# Patient Record
Sex: Female | Born: 1950 | Race: White | Hispanic: Refuse to answer | Marital: Single | Smoking: Former smoker
Health system: Southern US, Community
[De-identification: ages and names within clinical notes are randomized; demographics above are authoritative.]

## PROBLEM LIST (undated history)

## (undated) DIAGNOSIS — T7840XA Allergy, unspecified, initial encounter: Secondary | ICD-10-CM

## (undated) DIAGNOSIS — F419 Anxiety disorder, unspecified: Secondary | ICD-10-CM

## (undated) DIAGNOSIS — D649 Anemia, unspecified: Secondary | ICD-10-CM

## (undated) DIAGNOSIS — G8929 Other chronic pain: Secondary | ICD-10-CM

## (undated) DIAGNOSIS — R519 Headache, unspecified: Secondary | ICD-10-CM

## (undated) DIAGNOSIS — R51 Headache: Secondary | ICD-10-CM

## (undated) DIAGNOSIS — F32A Depression, unspecified: Secondary | ICD-10-CM

## (undated) DIAGNOSIS — J189 Pneumonia, unspecified organism: Secondary | ICD-10-CM

## (undated) HISTORY — DX: Anemia, unspecified: D64.9

## (undated) HISTORY — DX: Pneumonia, unspecified organism: J18.9

## (undated) HISTORY — PX: TUBAL LIGATION: SHX77

## (undated) HISTORY — DX: Depression, unspecified: F32.A

## (undated) HISTORY — DX: Allergy, unspecified, initial encounter: T78.40XA

## (undated) HISTORY — PX: BREAST LUMPECTOMY: SHX2

## (undated) HISTORY — DX: Anxiety disorder, unspecified: F41.9

## (undated) HISTORY — DX: Other chronic pain: G89.29

## (undated) HISTORY — DX: Headache, unspecified: R51.9

## (undated) HISTORY — DX: Headache: R51

---

## 2011-12-04 ENCOUNTER — Inpatient Hospital Stay: Payer: Self-pay | Admitting: Internal Medicine

## 2011-12-04 LAB — CBC
HCT: 29.8 % — ABNORMAL LOW (ref 35.0–47.0)
HGB: 10.6 g/dL — ABNORMAL LOW (ref 12.0–16.0)
MCH: 34.5 pg — ABNORMAL HIGH (ref 26.0–34.0)
MCHC: 35.5 g/dL (ref 32.0–36.0)
MCV: 97 fL (ref 80–100)
RBC: 3.06 10*6/uL — ABNORMAL LOW (ref 3.80–5.20)

## 2011-12-04 LAB — COMPREHENSIVE METABOLIC PANEL
Albumin: 2.9 g/dL — ABNORMAL LOW (ref 3.4–5.0)
Anion Gap: 11 (ref 7–16)
Calcium, Total: 9.3 mg/dL (ref 8.5–10.1)
Glucose: 107 mg/dL — ABNORMAL HIGH (ref 65–99)
Osmolality: 277 (ref 275–301)
Potassium: 3.5 mmol/L (ref 3.5–5.1)
SGOT(AST): 61 U/L — ABNORMAL HIGH (ref 15–37)
Total Protein: 8 g/dL (ref 6.4–8.2)

## 2011-12-04 LAB — CK TOTAL AND CKMB (NOT AT ARMC): CK, Total: 150 U/L (ref 21–215)

## 2011-12-04 LAB — VALPROIC ACID LEVEL: Valproic Acid: <3 ug/mL — ABNORMAL LOW

## 2011-12-04 LAB — PRO B NATRIURETIC PEPTIDE: B-Type Natriuretic Peptide: 3350 pg/mL — ABNORMAL HIGH (ref 0–125)

## 2011-12-05 LAB — IRON AND TIBC
Iron Bind.Cap.(Total): 197 ug/dL — ABNORMAL LOW (ref 250–450)
Iron Saturation: 14 %
Iron: 27 ug/dL — ABNORMAL LOW (ref 50–170)
Unbound Iron-Bind.Cap.: 170 ug/dL

## 2011-12-05 LAB — CBC WITH DIFFERENTIAL/PLATELET
Basophil #: 0 10*3/uL (ref 0.0–0.1)
Eosinophil #: 0.1 10*3/uL (ref 0.0–0.7)
HCT: 26.1 % — ABNORMAL LOW (ref 35.0–47.0)
Lymphocyte #: 0.7 10*3/uL — ABNORMAL LOW (ref 1.0–3.6)
Lymphocyte %: 5.8 %
MCH: 32 pg (ref 26.0–34.0)
MCHC: 33.8 g/dL (ref 32.0–36.0)
MCV: 95 fL (ref 80–100)
Monocyte #: 1.1 10*3/uL — ABNORMAL HIGH (ref 0.0–0.7)
Monocyte %: 9.2 %
Neutrophil #: 10.2 10*3/uL — ABNORMAL HIGH (ref 1.4–6.5)
Neutrophil %: 84.4 %
Platelet: 294 10*3/uL (ref 150–440)
RDW: 12.7 % (ref 11.5–14.5)
WBC: 12 10*3/uL — ABNORMAL HIGH (ref 3.6–11.0)

## 2011-12-05 LAB — BASIC METABOLIC PANEL
BUN: 21 mg/dL — ABNORMAL HIGH (ref 7–18)
Calcium, Total: 8.4 mg/dL — ABNORMAL LOW (ref 8.5–10.1)
Creatinine: 0.59 mg/dL — ABNORMAL LOW (ref 0.60–1.30)
EGFR (Non-African Amer.): >60
Glucose: 90 mg/dL (ref 65–99)
Sodium: 140 mmol/L (ref 136–145)

## 2011-12-05 LAB — HEMOGLOBIN A1C: Hemoglobin A1C: 5.7 % (ref 4.2–6.3)

## 2011-12-07 LAB — CBC WITH DIFFERENTIAL/PLATELET
Basophil #: 0 10*3/uL (ref 0.0–0.1)
Eosinophil %: 0 %
Lymphocyte #: 0.5 10*3/uL — ABNORMAL LOW (ref 1.0–3.6)
Lymphocyte %: 4.2 %
MCH: 32.1 pg (ref 26.0–34.0)
MCHC: 34.2 g/dL (ref 32.0–36.0)
MCV: 94 fL (ref 80–100)
Monocyte #: 0.5 10*3/uL (ref 0.0–0.7)
Monocyte %: 4.3 %
Neutrophil #: 10.5 10*3/uL — ABNORMAL HIGH (ref 1.4–6.5)
Neutrophil %: 91.4 %
Platelet: 424 10*3/uL (ref 150–440)
RBC: 2.68 10*6/uL — ABNORMAL LOW (ref 3.80–5.20)
RDW: 13.4 % (ref 11.5–14.5)

## 2011-12-07 LAB — BASIC METABOLIC PANEL
Anion Gap: 11 (ref 7–16)
BUN: 11 mg/dL (ref 7–18)
Calcium, Total: 8.5 mg/dL (ref 8.5–10.1)
Chloride: 101 mmol/L (ref 98–107)
EGFR (Non-African Amer.): >60
Glucose: 154 mg/dL — ABNORMAL HIGH (ref 65–99)
Osmolality: 291 (ref 275–301)
Potassium: 3 mmol/L — ABNORMAL LOW (ref 3.5–5.1)

## 2011-12-08 LAB — EXPECTORATED SPUTUM ASSESSMENT W GRAM STAIN, RFLX TO RESP C

## 2011-12-09 LAB — HEMOGLOBIN: HGB: 10.4 g/dL — ABNORMAL LOW (ref 12.0–16.0)

## 2011-12-10 LAB — CULTURE, BLOOD (SINGLE)

## 2011-12-26 LAB — EXPECTORATED SPUTUM ASSESSMENT W GRAM STAIN, RFLX TO RESP C

## 2011-12-30 ENCOUNTER — Encounter: Payer: Self-pay | Admitting: Pulmonary Disease

## 2011-12-30 ENCOUNTER — Ambulatory Visit (INDEPENDENT_AMBULATORY_CARE_PROVIDER_SITE_OTHER): Payer: Self-pay | Admitting: Pulmonary Disease

## 2011-12-30 DIAGNOSIS — H6690 Otitis media, unspecified, unspecified ear: Secondary | ICD-10-CM | POA: Insufficient documentation

## 2011-12-30 DIAGNOSIS — J441 Chronic obstructive pulmonary disease with (acute) exacerbation: Secondary | ICD-10-CM | POA: Insufficient documentation

## 2011-12-30 DIAGNOSIS — J449 Chronic obstructive pulmonary disease, unspecified: Secondary | ICD-10-CM | POA: Insufficient documentation

## 2011-12-30 DIAGNOSIS — J159 Unspecified bacterial pneumonia: Secondary | ICD-10-CM | POA: Insufficient documentation

## 2011-12-30 DIAGNOSIS — J189 Pneumonia, unspecified organism: Secondary | ICD-10-CM

## 2011-12-30 MED ORDER — BUDESONIDE-FORMOTEROL FUMARATE 160-4.5 MCG/ACT IN AERO: 2.0000 | INHALATION_SPRAY | Freq: Two times a day (BID) | RESPIRATORY_TRACT | Status: DC

## 2011-12-30 MED ORDER — CEFDINIR 300 MG PO CAPS: 600.0000 mg | ORAL_CAPSULE | Freq: Every day | ORAL | Status: AC

## 2011-12-30 NOTE — Assessment & Plan Note (Signed)
COPD is possible given her recent symptoms of shortness of breath and wheezing, but she has not had spirometry.  She is still not feeling well today due to the otitis media, so we will treat this prior to performing spirometry.  In the meantime, I have asked her to continue taking the symbicort and prn albuterol

## 2011-12-30 NOTE — Patient Instructions (Addendum)
You have ear infections in both ears. We will call in a prescription for Cefdinir 600mg  by mouth daily for one week. Use Neil Med rinses with distilled water at least twice per day using the instructions on the package. Use chlortrimeton and an over the counter decongestant (ask the pharmacist for a recommendation) as needed for congestion.  Continue using the inhalers as written.  We will see you back in one month for simple spirometry and a repeat Chest X-ray.

## 2011-12-30 NOTE — Assessment & Plan Note (Signed)
Bilateral otitis media noted on exam today.  Plan: -Omnicef for 7 days -Saline rinses, decongestants -consider CT sinuses or ENT referral if no improvement

## 2011-12-30 NOTE — Progress Notes (Signed)
Subjective:    Patient ID: Monique [name], female    DOB: [birth year], 61 y.o.   MRN: [id number]  HPI 61 y/o female presents to our clinic for evaluation after a recent hospitalization for multi-lobar pneumonia.  She states that in December 2012 she developed the progressive onset of shortness of breath, chest congestion, cough and sputum production.  She also noted sinus congestion and fullness and decreased hearing acuity.   She was admitted for what sounds like 7-10 days (I do not have the d/c summary and she can't remember the details).  She was discharged on levaquin and prednisone and now states that she is doing much better.  She was discharged on oxygen at 3 L per minute.  She states that she is now back to walking her dog around the house and her cough has nearly resolved.  However, she still has decreased hearing acuity in both ears and fullness in both ears.   She quit smoking just prior to the hospitalization.  Past Medical History  Diagnosis Date  . Chronic headaches   . Bilateral pneumonia      Family History  Problem Relation Age of Onset  . Emphysema Maternal Grandfather     smoked a pipe  . Stomach cancer Maternal Aunt   . Lung cancer Maternal Uncle     was a smoker     History   Social History  . Marital Status: Divorced    Spouse Name: N/A    Number of Children: 1  . Years of Education: N/A   Occupational History  . Unemployed    Social History Main Topics  . Smoking status: Former Smoker -- 0.8 packs/day for 42 years    Types: Cigarettes    Quit date: 12/02/2011  . Smokeless tobacco: Never Used  . Alcohol Use: No  . Drug Use: No  . Sexually Active: Not on file   Other Topics Concern  . Not on file   Social History Narrative  . No narrative on file     Allergies  Allergen Reactions  . Penicillins     "bleeding"     No outpatient prescriptions prior to visit.    Review of Systems  Constitutional: Negative for fever, chills and unexpected  weight change.  HENT: Positive for ear pain, nosebleeds, congestion and rhinorrhea. Negative for sore throat, sneezing, trouble swallowing, dental problem, voice change, postnasal drip and sinus pressure.   Eyes: Negative for visual disturbance.  Respiratory: Negative for cough, choking and shortness of breath.   Cardiovascular: Negative for chest pain and leg swelling.  Gastrointestinal: Negative for vomiting, abdominal pain and diarrhea.  Genitourinary: Negative for difficulty urinating.  Musculoskeletal: Negative for arthralgias.  Skin: Negative for rash.  Neurological: Negative for tremors, syncope and headaches.  Hematological: Bruises/bleeds easily.       Objective:   Physical Exam Filed Vitals:   12/30/11 1424  BP: 102/60  Pulse: 79  Temp: 97.9 F (36.6 C)  TempSrc: Oral  Height: 5\' 7"  (1.702 m)  Weight: 126 lb 1.9 oz (57.208 kg)  SpO2: 100%  O2: 3L/min [place]  Gen: well appearing, no acute distress HEENT: NCAT, PERRL, EOMi, OP clear, TM's erythematous, buldging Neck: supple without masses PULM: Few insp crackles RUL, otherwise clear CV: RRR, no mgr, no JVD AB: BS+, soft, nontender, no hsm Ext: warm, no edema, no clubbing, no cyanosis Derm: no rash or skin breakdown Neuro: A&Ox4, CN II-XII intact, strength 5/5 in all 4 extremities  Review of CXR  from 11/2011 [facility]: multi-lobar pneumonia  Review of CT chest 11/2011 [facility]: multi-lobar pneumonia, no mass     Assessment & Plan:   Otitis media Bilateral otitis media noted on exam today.  Plan: -Omnicef for 7 days -Saline rinses, decongestants -consider CT sinuses or ENT referral if no improvement  COPD (chronic obstructive pulmonary disease) COPD is possible given her recent symptoms of shortness of breath and wheezing, but she has not had spirometry.  She is still not feeling well today due to the otitis media, so we will treat this prior to performing spirometry.  In the meantime, I have asked her to continue  taking the symbicort and prn albuterol  Community acquired bacterial pneumonia She states that she feels much better and this appears to be resolving.  On exam today she had some residual crackles, but her improved exercise tolerance and lack of cough suggest this is improving well.  I will order a repeat CXR for her follow up visit in 4 weeks to ensure radiographic resolution.    Updated Medication List Outpatient Encounter Prescriptions as of 12/30/2011  Medication Sig Dispense Refill  . budesonide-formoterol (SYMBICORT) 160-4.5 MCG/ACT inhaler Inhale 2 puffs into the lungs 2 (two) times daily.  1 Inhaler  2  . divalproex (DEPAKOTE) 500 MG DR tablet Take 500 mg by mouth 2 (two) times daily.      . ferrous sulfate 325 (65 FE) MG tablet Take 325 mg by mouth daily.      . potassium chloride SA (K-DUR,KLOR-CON) 20 MEQ tablet Take 20 mEq by mouth daily.      . sertraline (ZOLOFT) 100 MG tablet Take 100 mg by mouth daily.      Marland Kitchen DISCONTD: budesonide-formoterol (SYMBICORT) 160-4.5 MCG/ACT inhaler Inhale 2 puffs into the lungs 2 (two) times daily.      . cefdinir (OMNICEF) 300 MG capsule Take 2 capsules (600 mg total) by mouth daily.  14 capsule  0

## 2011-12-30 NOTE — Assessment & Plan Note (Signed)
She states that she feels much better and this appears to be resolving.  On exam today she had some residual crackles, but her improved exercise tolerance and lack of cough suggest this is improving well.  I will order a repeat CXR for her follow up visit in 4 weeks to ensure radiographic resolution.

## 2012-01-01 ENCOUNTER — Telehealth: Payer: Self-pay | Admitting: Pulmonary Disease

## 2012-01-01 NOTE — Telephone Encounter (Signed)
I called her and addressed the issues.  Will leave Lloyd Huger Med gel spray at the desk here for her to pick up.  Have asked that she use the symbicort once per day until we see her next.

## 2012-01-01 NOTE — Telephone Encounter (Signed)
Received the following e-mail from this pt  Hello Monique [name]. I hope you're having a good day!  Having read the instructions on the parting paperwork I was giving at the end of my  appointment I went to [facility] and picked up the [place] Med rinse and the Chlortrimeton.  When I got home I read the back of the box on the [place] Med rinse and it clearly states  not to use the product if you have an ear infection. Hmmmm - since I have an ear infection in both  ears why did he recommend this product?  My symbicort (prescribed by the hospital for my double pnuemonia) is almost empty and they  didn't give any refills so he gave me another presciption for it which I had filled @ $257.88.  Having read the accompanying paperwork it says if you have a current infection it can make it worse.  Also he doubled the hopital's recommended dosage from one puff twice a day twice a day to two puffs  twice a day.  Until I hear back from you I won't use the Lloyd Huger Med product nor will I use the new prescription  for symbicort (I'm still using the one old one at the hosptial dosage).    Thank you,  Monique [name] and spoke with pt. Advised that I received her e-mail, but in the future the best way to reach [place] is by calling and leaving a telephone msg. Pt verbalized understanding of this.  Pt states that she is wondering about when to start using sinus rinses because package insert reads that pt's with active ear infections should not use this. She also states that she has no insurance and symbicort will cost over over 250 $.  She states that she also ready where symbicort can make infections worsen. She states does not mind paying for med if it will help her, but wants recs from Dr. Kendrick Fries. I advised that he is currently seeing pt's at this time, will forward him the msg and call her later with response. Please advise, thanks!

## 2012-01-06 ENCOUNTER — Telehealth: Payer: Self-pay | Admitting: Pulmonary Disease

## 2012-01-06 DIAGNOSIS — R42 Dizziness and giddiness: Secondary | ICD-10-CM

## 2012-01-06 DIAGNOSIS — H9190 Unspecified hearing loss, unspecified ear: Secondary | ICD-10-CM

## 2012-01-06 DIAGNOSIS — H6690 Otitis media, unspecified, unspecified ear: Secondary | ICD-10-CM

## 2012-01-06 DIAGNOSIS — J449 Chronic obstructive pulmonary disease, unspecified: Secondary | ICD-10-CM

## 2012-01-06 NOTE — Telephone Encounter (Signed)
Called and spoke with pt.  States she is still having difficulty with her ears.  States her R ear is better but still having difficulty hearing out of L ear.  Also c/o dizziness.  Denies any ear pain.    Also, pt states she is on o2 24/7 and would like an order sent to her DME company, [facility], for a smaller portable o2 tank for when she is out getting groceries, etc.  Pt also requests an order for a humidifier for her o2.    Dr. Kendrick Fries, please advise. Thank you!

## 2012-01-07 NOTE — Telephone Encounter (Signed)
Pt calling again in reference to previous message can be reached at [phone].Monique [name]

## 2012-01-07 NOTE — Telephone Encounter (Signed)
If she needs an Rx for this it is OK by, just let me know how to do it.  I asked her to call the company for this when she was in the office.  As for the ears, I told her she should see ENT if no improvement... So she needs a referral to ENT (apparently this has been going on for a month).  I am on nights right now and likely won't be available to answer questions again until tonight.  If you need me to to something just let me know.  Thanks, [employer]

## 2012-01-07 NOTE — Telephone Encounter (Signed)
If needed, I have written an Rx for humidity with O2 and a portable tank and left it at the front desk of the [place] office.  See my previous note about ENT referral.

## 2012-01-07 NOTE — Telephone Encounter (Signed)
Called and spoke with pt.  Pt aware of Dr. Ulyses Jarred recs.  I have placed an order for DME referral to [facility] to get pt portable o2 and humidifer and also referral for ENT.

## 2012-01-29 ENCOUNTER — Ambulatory Visit: Payer: Self-pay | Admitting: Pulmonary Disease

## 2012-07-31 IMAGING — CT CT CHEST W/O CM
1 of 2 series · 16 of 28 positions shown, 20 images · non-contrast
Comparison: none

REASON FOR EXAM: hypoxia, eval RLL
COMMENTS:

[Series 2: soft tissue · axial · 0.58mm/px · z∈[-862,-596]mm · 16 of 59 slices shown, 20 images]
[im 3/59  mediastinal]
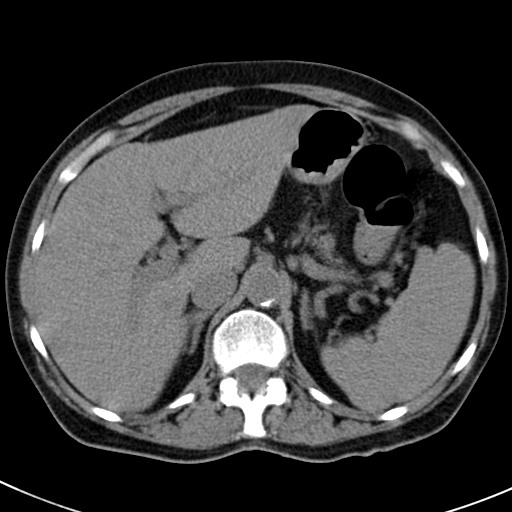
[im 3/59  lung]
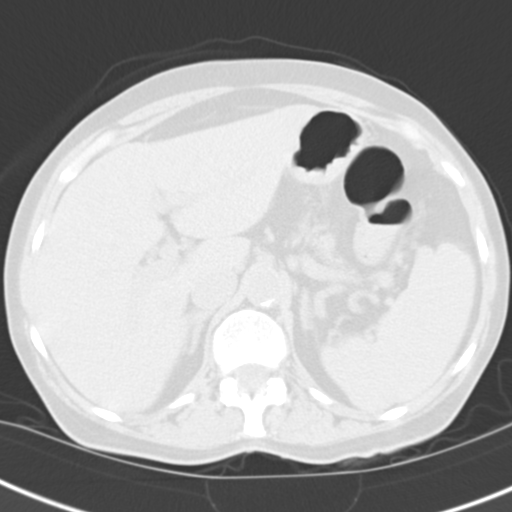
[im 8/59  lung]
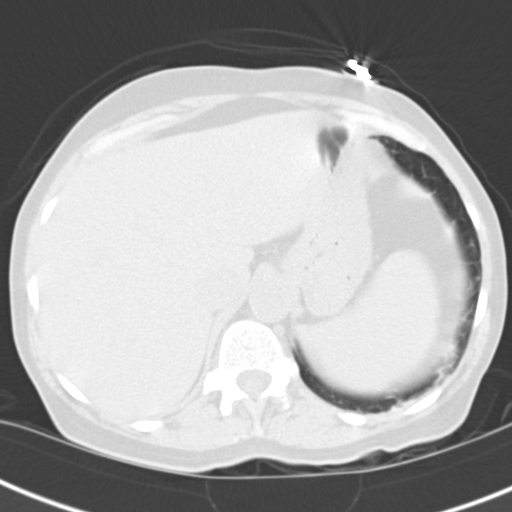
[im 10/59  lung]
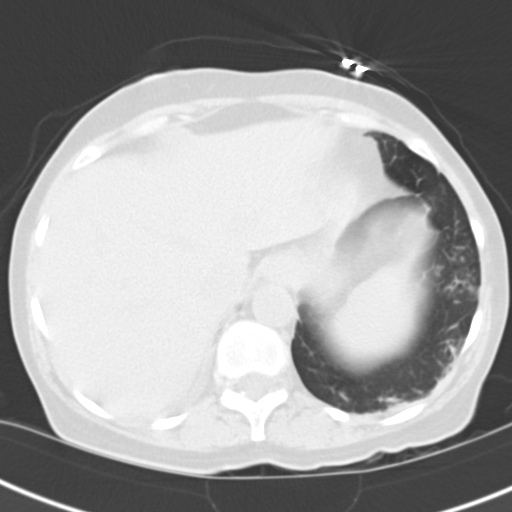
[im 15/59  lung]
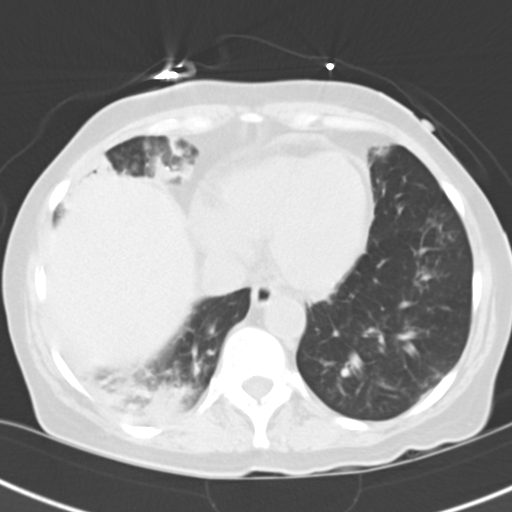
[im 17/59  mediastinal]
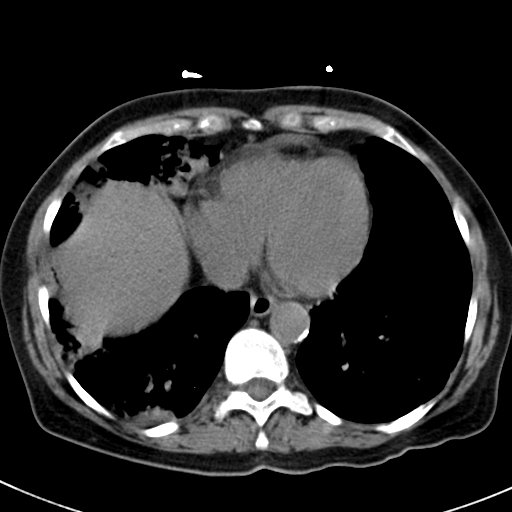
[im 17/59  lung]
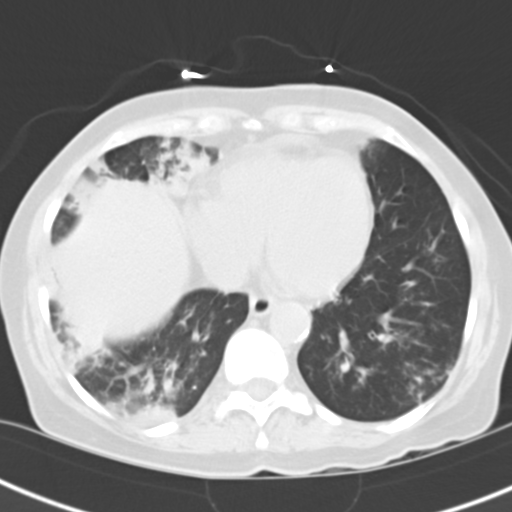
[im 20/59  lung]
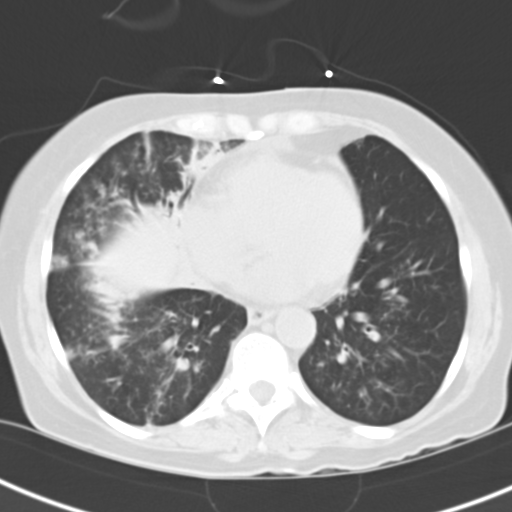
[im 25/59  lung]
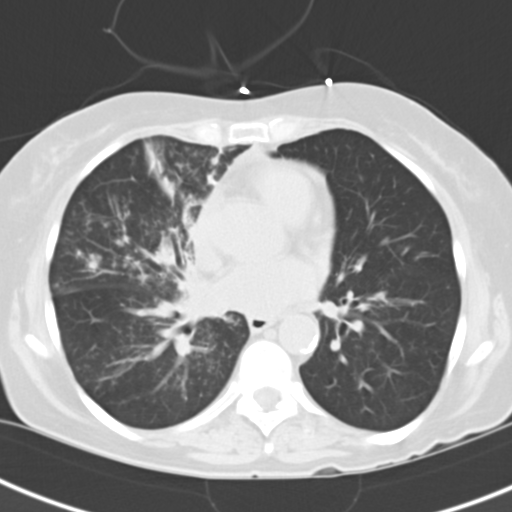
[im 27/59  lung]
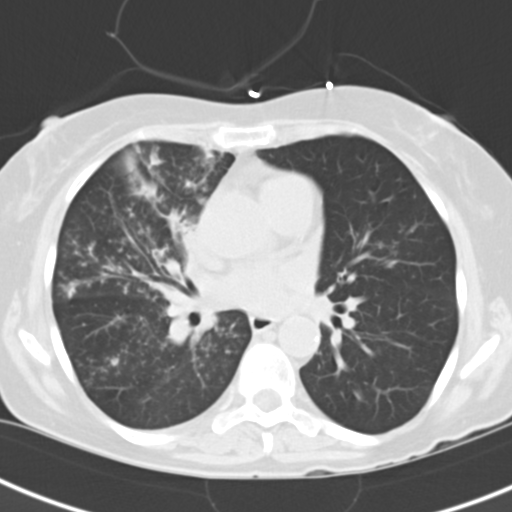
[im 32/59  mediastinal]
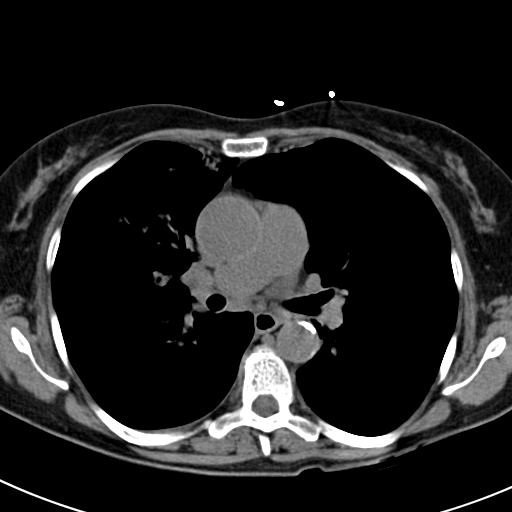
[im 32/59  lung]
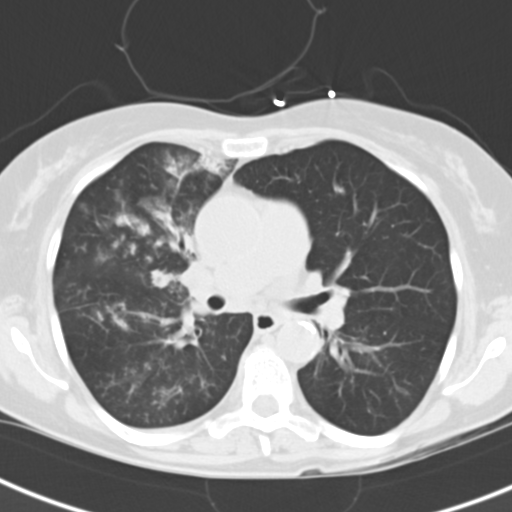
[im 34/59  lung]
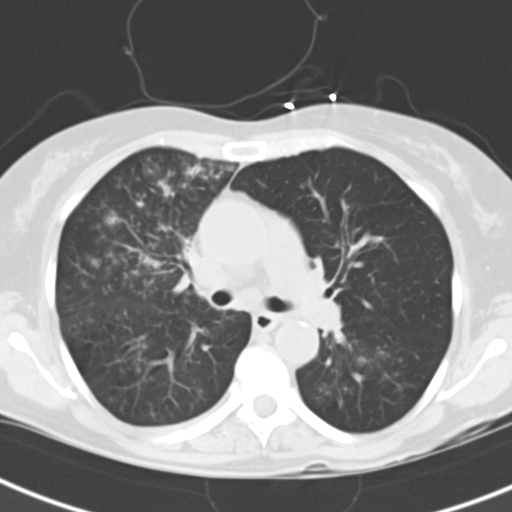
[im 39/59  lung]
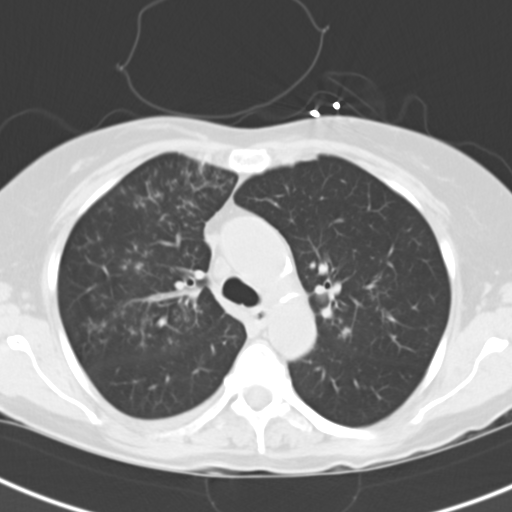
[im 42/59  lung]
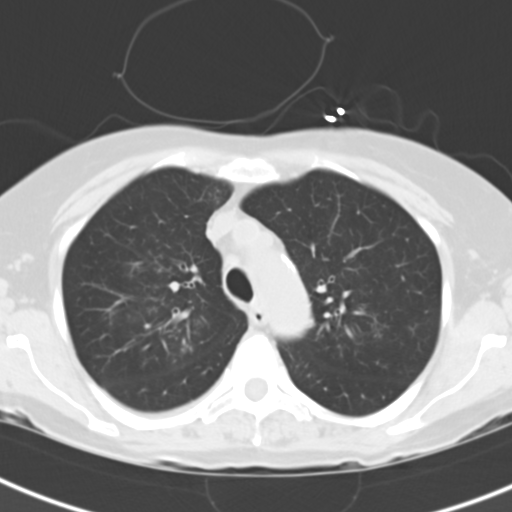
[im 44/59  mediastinal]
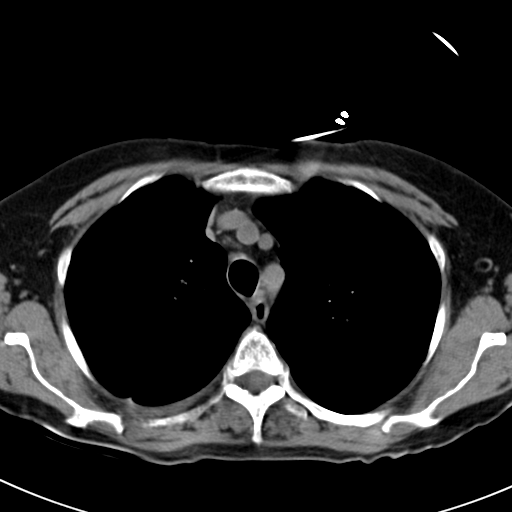
[im 44/59  lung]
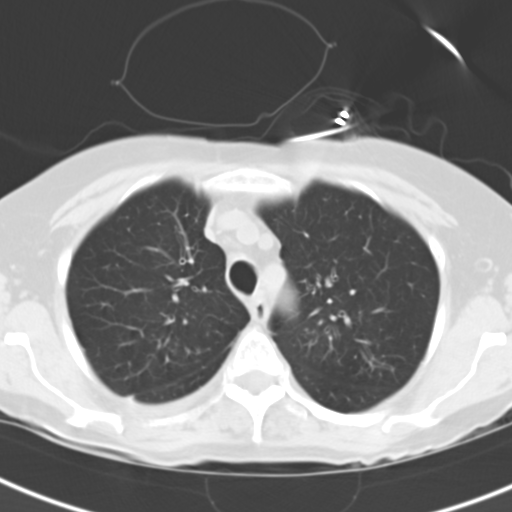
[im 49/59  lung]
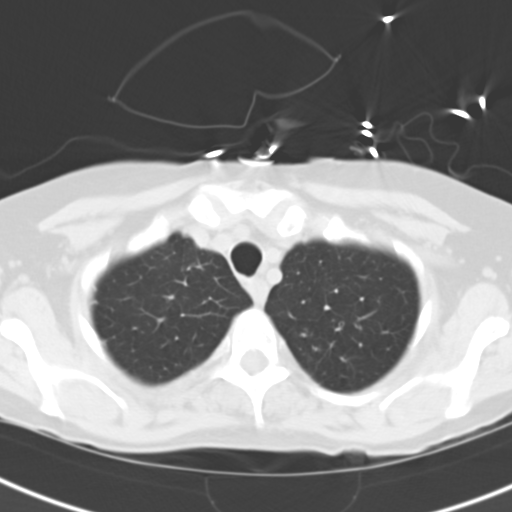
[im 51/59  lung]
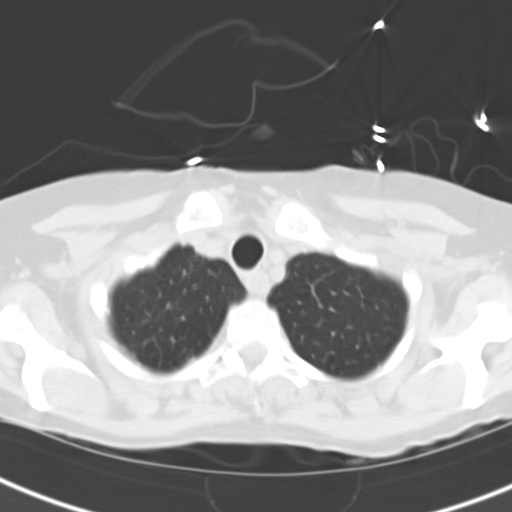
[im 56/59  lung]
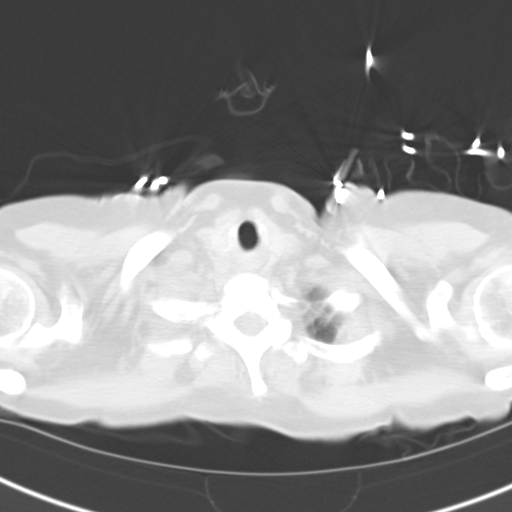

[16 of 28 positions shown; findings below may reference images not displayed]

PROCEDURE:     CT  - CT CHEST WITHOUT CONTRAST  - December 04, 2011  [DATE]

RESULT:     CT of the chest without contrast demonstrates right middle lobe,
right upper lobe and minimal right and left lower lobe pneumonia. This is
superimposed on diffuse emphysematous lung disease. No adenopathy is
evident. No significant effusion is present. There may be a trace pleural
effusion on the right. No pericardial effusion is demonstrated. The included
upper abdominal structures appear grossly normal area
IMPRESSION: 1. Patchy interstitial and alveolar infiltrates predominately on the right
and greatest in the right middle lobe and right lower lobe with some left
lower lobe and right upper lobe involvement. These changes are superimposed
on COPD. Trace right pleural effusion. Continued followup is recommended
with chest x-rays.

## 2014-08-04 ENCOUNTER — Ambulatory Visit: Payer: Self-pay

## 2015-03-19 NOTE — Discharge Summary (Signed)
PATIENT NAME:  Monique [name], [place] MR#:  [id number] DATE OF BIRTH:  Apr 02, 1951  DATE OF ADMISSION:  12/04/2011 DATE OF DISCHARGE:  12/09/2011  DISCHARGE DIAGNOSES:  1. Acute hypoxic respiratory failure likely due to bilateral pneumonia, improving, but will need home oxygen for now and maybe long-term.  2. Chronic obstructive pulmonary disease exacerbation with ongoing smoking, now improving.  3. Anemia of chronic disease, unsure exact etiology, possible iron deficiency, on iron replacement, stable hemoglobin and hematocrit.  4. Atrial fibrillation, transient and back in normal sinus rhythm.  5. Hypokalemia, repleted, and will require ongoing replacement. Could be due to poor oral intake.   SECONDARY DIAGNOSIS: Hyperacusis.   CONSULTATIONS:  1. Cardiology, Dr. Gwen PoundsKowalski.  2. Physical Therapy.   LABORATORY, DIAGNOSTIC AND RADIOLOGICAL DATA:  Chest x-ray on January 9th showed diffuse infiltrate in the right lower lung field compatible with pneumonia and atelectasis. Minimal infiltrate at the left base.  CT scan of the chest without contrast on January 9th showed patchy interstitial and alveolar infiltrate predominantly on the right and greater in the right middle and lower lobe with some left lower lobe and right upper lobe involvement. Trace right pleural effusion. Underlying chronic obstructive pulmonary disease.  Chest x-ray on January 11th showed persistent right lower lobe infiltrate and atelectasis.  A 2-D echocardiogram on January 9th showed normal LV size. No thrombus. Normal LV systolic function. Ejection fraction more than 55%.  Sputum culture grew moderate WBCs on January 10th. ____________________________ Ellamae SiaVipul S. Sherryll BurgerShah, MD vss:cbb D: 12/10/2011 16:01:20 ET T: 12/11/2011 10:03:36 ETJOB#: [id number]  Ellamae SiaVIPUL S [facility] MD ELECTRONICALLY SIGNED 12/12/2011 10:45

## 2015-03-19 NOTE — H&P (Signed)
PATIENT NAME:  Monique [name], Monique [name] MR#:  [id number] DATE OF BIRTH:  [birth year]  DATE OF ADMISSION:  12/04/2011  REFERRING PHYSICIAN: Daryel November, MD    CHIEF COMPLAINT: Shortness of breath, cough.   HISTORY OF PRESENT ILLNESS: The patient is a 64 year old Caucasian female with a history of hyperacusis who has been having shortness of breath, fevers, chills, and respiratory issues for three or more weeks. The patient has had a productive cough which is yellowish and at times blood-tinged. She has had fevers and chills off and on, and decreased p.o. intake, and 8-pound weight loss. The patient has been started on cefdinir on the 2nd and has not improved. The patient appears lethargic and on arrival here was hypoxic as well. The patient currently is on nonrebreather, and without oxygen  the saturations do drop to 80%. The patient has leukocytosis. She denies having any chest pains. She was given Levaquin, and the Hospitalist Service was  contacted for further evaluation and management.   PAST MEDICAL HISTORY: Hyperacusis.   MEDICATIONS:  1. Depakote 500 mg daily.  2. Zoloft 50 mg daily.   PAST SURGICAL HISTORY: None.  FAMILY HISTORY: Stomach and lung cancer.   SOCIAL HISTORY: The patient is a smoker, however, has not had tobacco in the past three weeks. No drugs. Occasional alcohol. She lives with her mom.    REVIEW OF SYSTEMS: CONSTITUTIONAL: The patient has fever, fatigue, weakness, and weight loss since the past 3+ weeks. EYES: No blurry vision or double vision. No redness. ENT: No tinnitus or ear pain. She has hyperacusis.  RESPIRATORY: The patient has a cough. No asthma or chronic obstructive pulmonary disease history. No painful respiration. CARDIOVASCULAR: No chest pain or orthopnea. No edema. No history of arrhythmia. The patient has some dyspnea on exertion. No syncope or palpitations. GI: No nausea, vomiting, diarrhea, abdominal pain. No melena or rectal bleeding. GENITOURINARY: No  dysuria or hematuria. ENDOCRINE: No polyuria or nocturia. HEME/LYMPH: No anemia or easy bruising. SKIN: No rashes. MUSCULOSKELETAL: No muscle or joint pains. NEUROLOGIC: General weakness. No focal weakness or dementia or ataxia. PSYCHIATRIC: No anxiety. She has depression.   PHYSICAL EXAMINATION:  VITAL SIGNS: Temperature on arrival 97.1, heart rate 120, respiratory rate 22, blood pressure 80/53, current blood pressure 100/57, oxygen saturation 99% on nonrebreather, dropped to 80% on room air.   GENERAL: Caucasian female in mild respiratory distress, lethargic-appearing. Lying in bed.   HEENT: Normocephalic, atraumatic. Pupils are equal and reactive. Dry mucous membranes. Anicteric sclerae.  NECK: Supple. No thyroid tenderness or lymphadenopathy in the neck.   CARDIOVASCULAR: S1, S2. Slightly tachycardic, 104. No murmurs, rubs, or gallops.   LUNGS: Decreased breath sounds rales, on the right, some scattered wheezing, some lesser rales on the left.   ABDOMEN: Soft, nontender, nondistended. Positive bowel sounds. No organomegaly noted.   EXTREMITIES: No significant edema.   NEUROLOGICAL: Cranial nerves II through XII are grossly intact. Strength 5 out of 5 in all extremities. There is just generalized weakness.   LABORATORY, DIAGNOSTIC AND RADIOLOGICAL DATA:  BNP 2350. Glucose 107, BUN 35, creatinine 0.81, sodium 134, sodium 3.5, chloride 92, albumin 2.9, AST 61, ALT 60.  CK total 150. Troponin negative. CK-MB 0.5.  WBC 16.3, hemoglobin 10.6, hematocrit 29.8.  INR 1.1. Venous pH 7.4.  X-ray of the chest, one view, showing diffuse infiltrate in the right lower lung field compatible with pneumonia and atelectasis, minimal infiltrate in the left base.  CT of the chest without contrast showing patchy interstitial and alveolar  infiltrates predominantly on the right and greatest in the right middle lobe and right lower lobe with some left lower lobe and right upper lobe involvement superimposed on  chronic obstructive pulmonary disease. EKG appears to be atrial flutter with variable AV block, no acute ST elevations or depressions.   ASSESSMENT AND PLAN: We have a 64 year old Caucasian female with a history of hyperacusis and depression, with subacute progressive shortness of breath, fevers, chills, cough, and acute hypoxic respiratory failure likely from bilateral pneumonia. At this point, we will admit the patient for IV antibiotics as she has failed outpatient therapy. We would start the patient on Levaquin and Zosyn for broad-spectrum antibiotic coverage. We will check urine strep and Legionella antigens as well as rapid flu. She stated that she had a sick contact, her grandchild, before she got sick. The patient would also be started on Robitussin and supplemental oxygen. The patient would need follow-up x-rays of the chest in 4 to 6 weeks to evaluate for resolution of pneumonia. The patient also appears to have elevated BUN and creatinine and poor p.o. intake. The patient also appears to have decreased hemoglobin and hematocrit. The patient denies having a history of anemia; however, she is clearly anemic here. It is possible that this is all from chronic disease and ongoing pneumonia. We will do frequent hemoglobin and hematocrit checks and check stool guaiac as well as iron studies. The patient does have elevated BUN/creatinine ratio signifying some prerenal azotemia, and on exam she appears to be dehydrated as well. I would continue IV fluids and monitor urine output. The patient does not have any history of arrhythmias, per patient. We would repeat an EKG in the morning and check an echocardiogram. I would continue the patient's Depakote at the previous dose and check a level. I would also resume her Zoloft. I would admit the patient with a remote monitor. For deep vein thrombosis prophylaxis, I would start the patient on SCDs given the low hemoglobin and hematocrit. There are no previous labs here  for us to compare to. We will start the patient on PPI for GI prophylaxis.   TOTAL TIME SPENT: 60 minutes.   CODE STATUS:  The patient is FULL CODE.   ____________________________ Krystal EatonShayiq Latondra Gebhart, MD sa:cbb D: 12/04/2011 17:11:50 ET T: 12/04/2011 18:24:43 ET JOB#: [id number]  cc: Krystal EatonShayiq Karessa Onorato, MD, <Dictator> Krystal EatonSHAYIQ Temitope Griffing MD ELECTRONICALLY SIGNED 12/06/2011 20:37

## 2015-03-19 NOTE — Consult Note (Signed)
Present Illness 60.  Her old female with no history of cardiovascular disease in the past was had a significant new onset of pneumonia, cough, and hypoxia.  The patient has had telemetry with irregular heartbeat, revealing atrial fibrillation with rapid ventricular rate.  The patient's baseline EKG shows normal sinus rhythm, normal EKG.  Currently the patient has had no evidence of significant hypotension, chest pain, or other significant concerns of congestive heart failure  Family history No family members with early onset of cardiovascular disease  Social history Patient currently denies alcohol or tobacco use   Physical Exam:   GEN WD    HEENT pink conjunctivae    NECK supple    RESP rhonchi  crackles    CARD Irregular rate and rhythm    ABD denies tenderness  soft    LYMPH negative neck    EXTR negative cyanosis/clubbing    SKIN normal to palpation    NEURO cranial nerves intact    PSYCH alert   Review of Systems:   Subjective/Chief Complaint patient is short of breath    Respiratory: Short of breath    Review of Systems: All other systems were reviewed and found to be negative    Medications/Allergies Reviewed Medications/Allergies reviewed     breeathing issues: no formal dx.   Migraines:    hyperaccusistics bilateral ears:    denies:   Home Medications:  sertraline 100 mg oral tablet: 1 tab(s) orally once a day, Active  cefdinir 300 mg oral capsule: 2 cap(s) orally once a day x 10 days. **start date 11/27/11 end date 12/06/11**, Active  Symbicort 160 mcg-4.5 mcg/inh inhalation aerosol: 1 puff(s) inhaled 2 times a day, Active  Ventolin HFA 90 mcg/inh inhalation aerosol: 1 to 2 puff(s) inhaled every 4 to 6 hours as needed., Active  divalproex sodium 500 mg oral delayed release tablet: 1 tab(s) orally 2 times a day. **brand name depakote**, Active  hydrocodone polistirex & chlorpheniramine suspension: 5 milliliter(s) orally 2 times a day, As Needed,  Active  Cardiology:  09-Jan-13 12:03    Ventricular Rate 93   Atrial Rate 340   QRS Duration 90   QT 356   QTc 442   P Axis -102   R Axis -8   T Axis 49  Routine Chem:  09-Jan-13 12:10    B-Type Natriuretic Peptide ([facility]) 3350  Cardiac:  09-Jan-13 12:10    CK, Total 150   CPK-MB, Serum 0.5  Routine Hem:  09-Jan-13 12:10    WBC (CBC) 16.3   RBC (CBC) 3.06   Hemoglobin (CBC) 10.6   Hematocrit (CBC) 29.8   Platelet Count (CBC) 380   MCV 97   MCH 34.5   MCHC 35.5   RDW 12.6  Routine Chem:  09-Jan-13 12:10    Glucose, Serum 107   BUN 35   Creatinine (comp) 0.81   Sodium, Serum 134   Potassium, Serum 3.5   Chloride, Serum 92   CO2, Serum 31   Calcium (Total), Serum 9.3  Hepatic:  09-Jan-13 12:10    Bilirubin, Total 0.6   Alkaline Phosphatase 67   SGPT (ALT) 60   SGOT (AST) 61   Total Protein, Serum 8.0   Albumin, Serum 2.9  Routine Chem:  09-Jan-13 12:10    Osmolality (calc) 277   eGFR (African American) >60   eGFR (Non-African American) >60   Anion Gap 11  Routine Coag:  09-Jan-13 12:10    Prothrombin 14.1   INR 1.1  Cardiac:  09-Jan-13 12:10    Troponin I < 0.02  Routine Chem:  09-Jan-13 12:10    Iron Binding Capacity (TIBC) 197   Ferritin ([facility]) 1463  TDMs:  09-Jan-13 12:10    Valproic Acid, Serum < 3  Lab:  09-Jan-13 12:45    pH (Venous) 7.40   PCO2 . 54  Routine Hem:  09-Jan-13 22:00    Hemoglobin (CBC) 8.8  10-Jan-13 03:08    WBC (CBC) 12.0   RBC (CBC) 2.75   Hemoglobin (CBC) 8.8   Hematocrit (CBC) 26.1   Platelet Count (CBC) 294   MCV 95   MCH 32.0   MCHC 33.8   RDW 12.7  Routine Chem:  10-Jan-13 03:08    Glucose, Serum 90   BUN 21   Creatinine (comp) 0.59   Sodium, Serum 140   Potassium, Serum 3.4   Chloride, Serum 101   CO2, Serum 29   Calcium (Total), Serum 8.4   Osmolality (calc) 282   eGFR (African American) >60   eGFR (Non-African American) >60   Anion Gap 10  Routine Hem:  10-Jan-13 03:08    Neutrophil % 84.4    Lymphocyte % 5.8   Monocyte % 9.2   Eosinophil % 0.5   Basophil % 0.1   Neutrophil # 10.2   Lymphocyte # 0.7   Monocyte # 1.1   Eosinophil # 0.1   Basophil # 0.0  Routine Chem:  10-Jan-13 03:08    Magnesium, Serum 2.3   Hemoglobin A1c ([facility]) 5.7   EKG:   EKG Interp. by me    Interpretation normal sinus rhythm, normal EKG    Penicillin: Rash  Vital Signs/Nurse's Notes: **Vital Signs.:   10-Jan-13 12:46   Vital Signs Type Routine   Pulse Pulse 150   Respirations Respirations 30   Systolic BP Systolic BP 414   Diastolic BP (mmHg) Diastolic BP (mmHg) 70   Mean BP 86   BP Source Dinamap   Pulse Ox % Pulse Ox % 93   Pulse Ox Activity Level  At rest   Oxygen Delivery Venturi Mask   Telemetry pattern Cardiac Rhythm Atrial fibrillation     Impression 64 year old female with no cardiovascular history, having significant pulmonary illness with hypoxemia, likely exacerbating or causing atrial fibrillation with rapid ventricular rate, and no current symptoms of myocardial infarction or congestive heart failure or angina    Plan 1.  Cardizem CD 120 mg for heart rate control and possible conversion to normal rhythm. 2.  Aspirin for further lower her risk of cardiovascular event, including stroke with atrial fibrillation due to low CHA DS score. 3.  Consider echocardiogram for LV systolic dysfunction, valvular heart disease or pulmonary hypertension causing above. 4.  Continue supportive care of significant pulmonary infection 5.  Further treatment options.  After about   Electronic Signatures: Corey Skains (MD)  (Signed 10-Jan-13 13:37)  Authored: General Aspect/Present Illness, History and Physical Exam, Review of System, Past Medical History, Home Medications, Labs, EKG , Allergies, Vital Signs/Nurse's Notes, Impression/Plan   Last Updated: 10-Jan-13 13:37 by Corey Skains (MD)

## 2015-03-19 NOTE — Discharge Summary (Signed)
PATIENT NAME:  Monique [name], Monique [name] MR#:  [id number] DATE OF BIRTH:  1951-09-05  DATE OF ADMISSION:  12/04/2011 DATE OF DISCHARGE:  12/09/2011  THIS IS CONTINUATION OF INCOMPLETELY DICTATED DISCHARGE SUMMARY.   LABORATORY, DIAGNOSTIC AND RADIOLOGICAL DATA: Sputum culture on 01/12 grew moderate WBCs, few epithelial cells. Blood cultures x2 were negative. Sputum culture on 01/13 grew light growth of yeast, moderate WBCs, otherwise negative.   HISTORY AND SHORT HOSPITAL COURSE: Patient is a 64 year old female with above-mentioned medical problems was admitted for acute hypoxic respiratory failure thought to be secondary to bilateral pneumonia. She was started on IV antibiotic. She was also found to have atrial flutter for which cardiology consult was obtained with Dr. Gwen PoundsKowalski who felt patient back in normal sinus rhythm. Did request Cardizem to be continued to keep her in sinus rhythm and 2-D echocardiogram which was obtained with results dictated above. Patient started getting somewhat hypotensive due to which Cardizem was discontinued. Patient was slowly improving on IV antibiotic, was working with physical therapy to get some strength while in the hospital. On 01/14 she was doing much better although was requiring at least 3 liters of oxygen which was set up for her to take at home by care management. She was also found to be hypokalemic while in the hospital and her potassium was repleted very aggressively throughout the hospital course. She was close to her baseline on 12/09/2011 and was discharged home in stable condition.   PHYSICAL EXAMINATION: VITAL SIGNS: On the date of discharge her vital signs are as follows: Temperature 98.9, heart rate 68 per minute, respirations 19 per minute, blood pressure 146/89 mmHg. She was saturating 94% on 2 liters oxygen via nasal cannula. Pertinent Physical Examination: CARDIOVASCULAR: S1, S2 normal. No murmur, rubs, or gallop. LUNGS: Clear to auscultation bilaterally. No  wheezing, rales, rhonchi, crepitation. ABDOMEN: Soft, benign. NEUROLOGIC: Nonfocal examination. All other physical examination remained at the baseline.   DISCHARGE MEDICATIONS:  1. Sertraline 100 mg p.o. daily.  2. Symbicort 160/4.5, 1 puff b.i.d.  3. Ventolin 90 mcg inhaler 1 to 2 puffs inhaled every 4 to 6 hours as needed.  4. Divalproex 500 mg p.o. b.i.d.  5. Hydrocodone/chlorpheniramine suspension 5 mL p.o. b.i.d. as needed.  6. Iron sulfate 325 mg p.o. daily. 7. Levaquin 750 mg p.o. daily for three days.  8. Prednisone 50 mg p.o. daily, taper 10 mg every other day until finished.  9. K-Dur 20 mEq p.o. daily.   DISCHARGE DIET: Low sodium.   DISCHARGE ACTIVITY: As tolerated.   DISCHARGE INSTRUCTIONS AND FOLLOW UP: Patient was instructed to follow up with her primary care physician at [facility] in 1 to 2 weeks. [facility] 2 to 3 weeks, with [facility], Dr. Kendrick FriesMcQuaid, in 3 to 4 weeks. She was set up to get home health with physical therapy and nursing. She will need evaluation of anemia if felt appropriate. She was set up to get 3 liters of oxygen via nasal cannula continuous for her ongoing hypoxia. She will need evaluation in next two weeks with her primary care physician or pulmonary for long term need.  TOTAL TIME DISCHARGING THIS PATIENT: 55 minutes.   ____________________________ Ellamae SiaVipul S. Sherryll BurgerShah, MD vss:cms D: 12/10/2011 16:13:12 ET T: 12/11/2011 10:10:00 ET JOB#: [id number]  cc: Habib Kise S. Sherryll BurgerShah, MD, <Dictator> [facility]   [facility]   Ellamae SiaVIPUL S [place] [facility] MD ELECTRONICALLY SIGNED 12/12/2011 10:45

## 2018-04-13 DIAGNOSIS — H93233 Hyperacusis, bilateral: Secondary | ICD-10-CM | POA: Insufficient documentation

## 2018-05-20 DIAGNOSIS — R519 Headache, unspecified: Secondary | ICD-10-CM | POA: Insufficient documentation

## 2020-01-08 ENCOUNTER — Ambulatory Visit: Payer: Self-pay | Attending: Internal Medicine

## 2020-01-08 ENCOUNTER — Other Ambulatory Visit: Payer: Self-pay

## 2020-01-08 DIAGNOSIS — Z23 Encounter for immunization: Secondary | ICD-10-CM | POA: Insufficient documentation

## 2020-01-08 NOTE — Progress Notes (Signed)
   Covid-19 Vaccination Clinic  Name:  Latesha Chesney    MRN: [id number] DOB: [birth year]  01/08/2020  Ms. Gaumer was observed post Covid-19 immunization for 15 minutes without incidence. She was provided with Vaccine Information Sheet and instruction to access the V-Safe system.   Ms. Sobol was instructed to call 911 with any severe reactions post vaccine: Marland Kitchen Difficulty breathing  . Swelling of your face and throat  . A fast heartbeat  . A bad rash all over your body  . Dizziness and weakness    Immunizations Administered    Name Date Dose VIS Date Route   Pfizer COVID-19 Vaccine 01/08/2020 11:31 AM 0.3 mL 11/05/2019 Intramuscular   Manufacturer: [employer], [employer]   Lot: [id number]   NDC: [address]-7

## 2020-02-02 ENCOUNTER — Ambulatory Visit: Payer: Self-pay | Attending: Internal Medicine

## 2020-02-02 DIAGNOSIS — Z23 Encounter for immunization: Secondary | ICD-10-CM | POA: Insufficient documentation

## 2020-02-02 NOTE — Progress Notes (Signed)
   Covid-19 Vaccination Clinic  Name:  Petronella Shuford    MRN: [id number] DOB: 25-Jan-1951  02/02/2020  Ms. Bluitt was observed post Covid-19 immunization for 15 minutes without incident. She was provided with Vaccine Information Sheet and instruction to access the V-Safe system.   Ms. Billingham was instructed to call 911 with any severe reactions post vaccine: Marland Kitchen Difficulty breathing  . Swelling of face and throat  . A fast heartbeat  . A bad rash all over body  . Dizziness and weakness   Immunizations Administered    Name Date Dose VIS Date Route   Pfizer COVID-19 Vaccine 02/02/2020  1:24 PM 0.3 mL 11/05/2019 Intramuscular   Manufacturer: [employer], [employer]   Lot: [id number]   NDC: [address]-1

## 2020-08-28 ENCOUNTER — Ambulatory Visit: Payer: Self-pay | Attending: Internal Medicine

## 2020-08-28 DIAGNOSIS — Z23 Encounter for immunization: Secondary | ICD-10-CM

## 2020-08-28 NOTE — Progress Notes (Signed)
   Covid-19 Vaccination Clinic  Name:  Monique [name]    MRN: [id number] DOB: 1950-12-13  08/28/2020  Monique [name] was observed post Covid-19 immunization for 15 minutes without incident. She was provided with Vaccine Information Sheet and instruction to access the V-Safe system.   Monique [name] was instructed to call 911 with any severe reactions post vaccine: Marland Kitchen Difficulty breathing  . Swelling of face and throat  . A fast heartbeat  . A bad rash all over body  . Dizziness and weakness

## 2021-03-09 ENCOUNTER — Ambulatory Visit: Payer: Self-pay | Attending: Internal Medicine

## 2021-03-09 ENCOUNTER — Other Ambulatory Visit: Payer: Self-pay

## 2021-03-09 DIAGNOSIS — Z23 Encounter for immunization: Secondary | ICD-10-CM

## 2021-03-09 MED ORDER — PFIZER-BIONT COVID-19 VAC-TRIS 30 MCG/0.3ML IM SUSP
INTRAMUSCULAR | 0 refills | Status: DC
  Filled 2021-03-09: qty 0.3, 1d supply, fill #0

## 2021-03-09 NOTE — Progress Notes (Signed)
   Covid-19 Vaccination Clinic  Name:  Monique [name]    MRN: [id number] DOB: Jun 12, 1951  03/09/2021  [name] was observed post Covid-19 immunization for 15 minutes without incident. She was provided with Vaccine Information Sheet and instruction to access the V-Safe system.   [name] was instructed to call 911 with any severe reactions post vaccine: Marland Kitchen Difficulty breathing  . Swelling of face and throat  . A fast heartbeat  . A bad rash all over body  . Dizziness and weakness   Immunizations Administered    Name Date Dose VIS Date Route   PFIZER Comrnaty(Gray TOP) Covid-19 Vaccine 03/09/2021  1:59 PM 0.3 mL 11/02/2020 Intramuscular   Manufacturer: [employer], [employer]   Lot: [id number]   NDC: [phone]

## 2021-08-24 ENCOUNTER — Other Ambulatory Visit: Payer: Self-pay

## 2021-08-24 ENCOUNTER — Ambulatory Visit: Payer: Self-pay | Attending: Internal Medicine

## 2021-08-24 DIAGNOSIS — Z23 Encounter for immunization: Secondary | ICD-10-CM

## 2021-08-24 MED ORDER — PFIZER COVID-19 VAC BIVALENT 30 MCG/0.3ML IM SUSP
INTRAMUSCULAR | 0 refills | Status: DC
  Filled 2021-08-24: qty 0.3, 30d supply, fill #0

## 2021-08-24 NOTE — Progress Notes (Signed)
   Covid-19 Vaccination Clinic  Name:  Monique [name]    MRN: [id number] DOB: 1951-01-08  08/24/2021  Monique [name] was observed post Covid-19 immunization for 15 minutes without incident. She was provided with Vaccine Information Sheet and instruction to access the V-Safe system.   Monique [name] was instructed to call 911 with any severe reactions post vaccine: Difficulty breathing  Swelling of face and throat  A fast heartbeat  A bad rash all over body  Dizziness and weakness   Drusilla Kanner, PharmD, MBA Clinical Acute Care Pharmacist

## 2023-08-29 ENCOUNTER — Other Ambulatory Visit: Payer: Self-pay

## 2023-09-01 ENCOUNTER — Other Ambulatory Visit: Payer: Self-pay

## 2023-09-01 MED ORDER — FLUAD 0.5 ML IM SUSY
0.5000 mL | PREFILLED_SYRINGE | Freq: Once | INTRAMUSCULAR | 0 refills | Status: AC
  Filled 2023-09-01: qty 0.5, 1d supply, fill #0

## 2023-09-01 MED ORDER — COMIRNATY 30 MCG/0.3ML IM SUSY
0.3000 mL | PREFILLED_SYRINGE | Freq: Once | INTRAMUSCULAR | 0 refills | Status: AC
  Filled 2023-09-01: qty 0.3, 1d supply, fill #0

## 2024-01-22 ENCOUNTER — Ambulatory Visit: Payer: Self-pay | Admitting: Family Medicine

## 2024-01-22 NOTE — Telephone Encounter (Signed)
  Chief Complaint: wound right lower leg red possible infection  , requesting new patient appt. Symptoms: reports falling 3 weeks ago hitting right lower leg. Wound 1 inch in length, open not healing . Skin around wound red yellow. Denies draining. Frequency: 3 weeks ago  Pertinent Negatives: Patient denies fever  Disposition: [] ED /[x] Urgent Care (no appt availability in office) / [] Appointment(In office/virtual)/ []  [address] [facility]/ [] Home Care/ [] Refused Recommended Disposition /[] [place] [facility]/ []  Follow-up with PCP Additional Notes:   Recommended UC due to no PCP. Scheduled [facility] app[facility] at [facility] .       Copied from CRM [id number]. Topic: Appointments - Appointment Scheduling >> Jan 22, 2024  4:08 PM Monique [name] wrote: Patient/patient representative is calling to schedule an appointment. Refer to attachments for appointment information.  Patient has unhealing wound lower rt leg and thinks it is infected Reason for Disposition  [1] Looks infected (spreading redness, red streak) AND [2] no fever  Answer Assessment - Initial Assessment Questions 1. LOCATION: "Where is the wound located?"      Right lower leg 2. WOUND APPEARANCE: "What does the wound look like?"      Red and yellow around wound 3. SIZE: If redness is present, ask: "What is the size of the red area?" (Inches, centimeters, or compare to size of a coin)      Redness around wound approx 1 inch 4. SPREAD: "What's changed in the last day?"  "Do you see any red streaks coming from the wound?"     Not healing  5. ONSET: "When did it start to look infected?"      Na  6. MECHANISM: "How did the wound start, what was the cause?"     Fell  3 weeks ago  7. PAIN: Do you have any pain?"  If Yes, ask: "How bad is the pain?"  (e.g., Scale 1-10; mild, moderate, or severe)    - MILD (1-3): Doesn't interfere with normal activities.     - MODERATE (4-7): Interferes with normal activities or awakens from sleep.    - SEVERE  (8-10): Excruciating pain, unable to do any normal activities.       No pain with standing .  8. FEVER: "Do you have a fever?" If Yes, ask: "What is your temperature, how was it measured, and when did it start?"     na 9. OTHER SYMPTOMS: "Do you have any other symptoms?" (e.g., shaking chills, weakness, rash elsewhere on body)     Right low leg, wound open red,  1 inch long  10. PREGNANCY: "Is there any chance you are pregnant?" "When was your last menstrual period?"       na  Protocols used: Wound Infection Suspected-A-AH

## 2024-03-10 ENCOUNTER — Ambulatory Visit: Payer: Self-pay | Admitting: Pediatrics

## 2024-04-30 ENCOUNTER — Encounter: Payer: Self-pay | Admitting: Pediatrics

## 2024-04-30 ENCOUNTER — Ambulatory Visit (INDEPENDENT_AMBULATORY_CARE_PROVIDER_SITE_OTHER): Payer: Self-pay | Admitting: Pediatrics

## 2024-04-30 VITALS — BP 126/77 | HR 83 | Temp 98.9°F | Ht 65.0 in | Wt 126.0 lb

## 2024-04-30 DIAGNOSIS — Z1330 Encounter for screening examination for mental health and behavioral disorders, unspecified: Secondary | ICD-10-CM

## 2024-04-30 DIAGNOSIS — R202 Paresthesia of skin: Secondary | ICD-10-CM

## 2024-04-30 DIAGNOSIS — R200 Anesthesia of skin: Secondary | ICD-10-CM

## 2024-04-30 DIAGNOSIS — Z7689 Persons encountering health services in other specified circumstances: Secondary | ICD-10-CM

## 2024-04-30 DIAGNOSIS — T148XXA Other injury of unspecified body region, initial encounter: Secondary | ICD-10-CM

## 2024-04-30 DIAGNOSIS — G4700 Insomnia, unspecified: Secondary | ICD-10-CM | POA: Insufficient documentation

## 2024-04-30 MED ORDER — TRAZODONE HCL 50 MG PO TABS: 25.0000 mg | ORAL_TABLET | Freq: Every evening | ORAL | 3 refills | Status: DC | PRN

## 2024-04-30 NOTE — Progress Notes (Signed)
 Establish Care Note  BP 126/77   Pulse 83   Temp 98.9 F (37.2 C) (Oral)   Ht 5\' 5"  (1.651 m)   Wt 126 lb (57.2 kg)   SpO2 96%   BMI 20.97 kg/m    Subjective:    Patient ID: Monique [name], female    DOB: 12/24/1950, 73 y.o.   MRN: [id number]  HPI: Monique [name] is a 73 y.o. female  Chief Complaint  Patient presents with   Establish Care    Having issues with sleeping Fell back in february Numbness in left arm that comes and goes when holding cell phone     Establishing care, the following was discussed today:  Discussed the use of AI scribe software for clinical note transcription with the patient, who gave verbal consent to proceed.  History of Present Illness   Monique [name] is a 73 year old female who presents with sleep disturbances and a non-healing leg wound.  She experiences difficulty with sleep, often waking at 2 or 3 AM and struggling to fall back asleep until it's nearly time to wake up. She has tried melatonin and Benadryl without success and recalls using a strong sleep medication years ago that is no longer available. Currently, she is taking Topamax, which has not alleviated her sleep issues.  She has a non-healing wound on her leg that has persisted since a fall in February, approximately four months ago. Initially, the wound had necrotic skin that she removed, and she has been applying Vaseline to it. While the wound has shown some improvement, it has not fully healed.  She experiences numbness in her left arm, particularly when holding her cell phone. Exercises to improve circulation provide some relief. No significant pain is reported, but there is discomfort when her elbow is moved.  Her past medical history includes COPD-like symptoms following a bout of double pneumonia before she turned 61. She is not currently using any inhalers and denies a formal diagnosis of COPD.  She lives alone and describes herself as a 'homebody'. She previously  took early social security to care for her mother, which reduced her benefits. She does not have health insurance but has hospital coverage.        Current Outpatient Medications on File Prior to Visit  Medication Sig Dispense Refill   sertraline (ZOLOFT) 100 MG tablet Take 100 mg by mouth daily.     topiramate (TOPAMAX) 50 MG tablet Take 1 tablet by mouth daily.     No current facility-administered medications on file prior to visit.    #HM Will review HM records and updated as needed.  Relevant past medical, surgical, family and social history reviewed and updated as indicated. Interim medical history since our last visit reviewed. Allergies and medications reviewed and updated.  ROS per HPI unless specifically indicated above     Objective:     BP 126/77   Pulse 83   Temp 98.9 F (37.2 C) (Oral)   Ht 5\' 5"  (1.651 m)   Wt 126 lb (57.2 kg)   SpO2 96%   BMI 20.97 kg/m   Wt Readings from Last 3 Encounters:  04/30/24 126 lb (57.2 kg)  12/30/11 126 lb 1.9 oz (57.2 kg)     Physical Exam Constitutional:      Appearance: Normal appearance.  Pulmonary:     Effort: Pulmonary effort is normal.  Musculoskeletal:        General: Normal range of motion.  Skin:    Findings: Lesion  present.     Comments: Right lower shin in skin abrasion, crusted over appropriate healing  Neurological:     General: No focal deficit present.     Mental Status: She is alert. Mental status is at baseline.  Psychiatric:        Mood and Affect: Mood normal.        Behavior: Behavior normal.        Thought Content: Thought content normal.         04/30/2024    2:02 PM  Depression screen PHQ 2/9  Decreased Interest 0  Down, Depressed, Hopeless 0  PHQ - 2 Score 0  Altered sleeping 3  Tired, decreased energy 0  Change in appetite 0  Feeling bad or failure about yourself  0  Trouble concentrating 3  Moving slowly or fidgety/restless 0  Suicidal thoughts 0  PHQ-9 Score 6  Difficult  doing work/chores Somewhat difficult        04/30/2024    2:02 PM  GAD 7 : Generalized Anxiety Score  Nervous, Anxious, on Edge 0  Control/stop worrying 0  Worry too much - different things 0  Trouble relaxing 0  Restless 3  Easily annoyed or irritable 1  Afraid - awful might happen 0  Total GAD 7 Score 4       Assessment & Plan:  Assessment & Plan   Insomnia, unspecified type Assessment & Plan: Discussed trazodone  for sleep. Explained dosing and potential need for alternative medications if ineffective. - Prescribe trazodone , start with half a tablet 30 minutes to an hour before bedtime. If not asleep within 30 minutes, take the second half.  Orders: -     traZODone  HCl; Take 0.5-1 tablets (25-50 mg total) by mouth at bedtime as needed for sleep.  Dispense: 30 tablet; Refill: 3  Numbness and tingling in left hand Assessment & Plan: Intermittent suspect related to positioning. Provided exercises to improve circulation and mobility. Brace not necessary now but may prevent symptom worsening. - Provide printed exercises for wrist to improve circulation. - Consider use of a carpal tunnel brace if symptoms worsen.  Skin abrasion Wound healing slowly but normal appearing on exam. Recommended betadine for drying and petroleum jelly for healing. - Apply betadine to the wound to promote drying. - Apply petroleum jelly after the betadine has dried.  Encounter to establish care Reviewed available patient record including history, medications, problem list. HM updated as able. Will review and/or request outside records (if applicable) and will fill remaining HM gaps as needed at follow up visit.  Encounter for behavioral health screening As part of their intake evaluation, the patient was screened for depression, anxiety.  PHQ9 SCORE 6, GAD7 SCORE 4. Screening results negative for tested conditions. CTM.   Follow up plan: Return in about 1 year (around 04/30/2025) for  Physical.  Hadassah Letters, MD

## 2024-05-03 ENCOUNTER — Encounter: Payer: Self-pay | Admitting: Pediatrics

## 2024-05-03 DIAGNOSIS — R200 Anesthesia of skin: Secondary | ICD-10-CM | POA: Insufficient documentation

## 2024-05-03 DIAGNOSIS — R202 Paresthesia of skin: Secondary | ICD-10-CM | POA: Insufficient documentation

## 2024-05-03 NOTE — Assessment & Plan Note (Signed)
 Intermittent suspect related to positioning. Provided exercises to improve circulation and mobility. Brace not necessary now but may prevent symptom worsening. - Provide printed exercises for wrist to improve circulation. - Consider use of a carpal tunnel brace if symptoms worsen.

## 2024-05-03 NOTE — Assessment & Plan Note (Signed)
 Discussed trazodone  for sleep. Explained dosing and potential need for alternative medications if ineffective. - Prescribe trazodone , start with half a tablet 30 minutes to an hour before bedtime. If not asleep within 30 minutes, take the second half.

## 2024-05-05 ENCOUNTER — Ambulatory Visit: Payer: Self-pay

## 2024-05-05 NOTE — Telephone Encounter (Signed)
 FYI Only or Action Required?: Action required by provider  Patient was last seen in primary care on 04/30/2024 by Hadassah Letters, MD. Called Nurse Triage reporting Medication Reaction. Symptoms began today. Interventions attempted: Other: elevation. Symptoms are: unchanged.  Triage Disposition: Call PCP Now  Patient/caregiver understands and will follow disposition?: Yes       Copied from CRM [id number]. Topic: Clinical - Red Word Triage >> May 05, 2024  2:21 PM Baldomero Bone wrote: Red Word that prompted transfer to Nurse Triage: Patient as taken half a table of traZODone  (DESYREL ) 50 MG tablet twice. Patient's right leg and foot are swollen. She is unsure if it is related. Callback number is [phone] Reason for Disposition  [1] Caller has URGENT medicine question about med that PCP or specialist prescribed AND [2] triager unable to answer question  Answer Assessment - Initial Assessment Questions 1. NAME of MEDICINE: What medicine(s) are you calling about?     trazodone  2. QUESTION: What is your question? (e.g., double dose of medicine, side effect)     Side effects 3. PRESCRIBER: Who prescribed the medicine? Reason: if prescribed by specialist, call should be referred to that group.     Dr Juliette Oh 4. SYMPTOMS: Do you have any symptoms? If Yes, ask: What symptoms are you having?  How bad are the symptoms (e.g., mild, moderate, severe)      Pt states she has taken two does of the medication and this morning had swelling in her right leg up to her knee. States her foot is the worse. Denies anything biting her.  Protocols used: Medication Question Call-A-AH

## 2024-05-06 ENCOUNTER — Telehealth: Payer: Self-pay

## 2024-05-06 ENCOUNTER — Ambulatory Visit: Payer: Self-pay

## 2024-05-06 NOTE — Telephone Encounter (Signed)
 Ok for E2C2 to review.  Returned call to patient however had to leave a message. Please advise patient that we have no same day appointments today/tomorrow. She needs to be seen urgently and provider recommendation is ED/UC.

## 2024-05-06 NOTE — Telephone Encounter (Signed)
 Copied from CRM [id number]. Topic: Clinical - Red Word Triage >> May 05, 2024  2:21 PM Baldomero Bone wrote: Red Word that prompted transfer to Nurse Triage: Patient as taken half a table of traZODone  (DESYREL ) 50 MG tablet twice. Patient's right leg and foot are swollen. She is unsure if it is related. Callback number is [phone] >> May 05, 2024  6:00 PM Felizardo Hotter wrote: Patient as taken half a table of traZODone  (DESYREL ) 50 MG tablet twice. Patient's right leg and foot are swollen. She is unsure if it is related. Pt wants a call back number is [phone].   Please advise?

## 2024-05-06 NOTE — Telephone Encounter (Signed)
 FYI Only or Action Required?: Action required by provider  Patient was last seen in primary care on 04/30/2024 by Hadassah Letters, MD. Called Nurse Triage reporting Leg Swelling. Symptoms began several days ago. Interventions attempted: Rest, hydration, or home remedies. Symptoms are: gradually worsening.  Triage Disposition: See HCP Within 4 Hours (Or PCP Triage)  Patient/caregiver understands and will follow disposition?: No, wishes to speak with PCP                  Reason for Disposition  [1] Thigh, calf, or ankle swelling AND [2] only 1 side  Answer Assessment - Initial Assessment Questions RN advised pt that leg swelling is not a side effect of trazodone  and that she needs to be evaluated within 4 hours. RN advised UC. Pt declined.  1. ONSET: When did the swelling start? (e.g., minutes, hours, days)     Started taking Trazodone , then her R foot and leg to the knee began swelling  2. LOCATION: What part of the leg is swollen?  Are both legs swollen or just one leg?     R leg - to her knee  3. SEVERITY: How bad is the swelling? (e.g., localized; mild, moderate, severe)   - Localized: Small area of swelling localized to one leg.   - MILD pedal edema: Swelling limited to foot and ankle, pitting edema < 1/4 inch (6 mm) deep, rest and elevation eliminate most or all swelling.   - MODERATE edema: Swelling of lower leg to knee, pitting edema > 1/4 inch (6 mm) deep, rest and elevation only partially reduce swelling.   - SEVERE edema: Swelling extends above knee, facial or hand swelling present.      To the knee  4. REDNESS: Does the swelling look red or infected?     Denies  5. PAIN: Is the swelling painful to touch? If Yes, ask: How painful is it?   (Scale 1-10; mild, moderate or severe)     Pt states it hurts to walk -- not anywhere near 10, but enough to make you limp a little bit 6. FEVER: Do you have a fever? If Yes, ask: What is it, how was it measured,  and when did it start?      Denies  7. CAUSE: What do you think is causing the leg swelling?     Not sure  8. MEDICAL HISTORY: Do you have a history of blood clots (e.g., DVT), cancer, heart failure, kidney disease, or liver failure?     Denies  9. RECURRENT SYMPTOM: Have you had leg swelling before? If Yes, ask: When was the last time? What happened that time?     One time years ago I slipped on wet leaves and my foot swelled up 10. OTHER SYMPTOMS: Do you have any other symptoms? (e.g., chest pain, difficulty breathing)  Denies drainage, denies insect bites  Denies calf pain   Denies CP and SOB  Protocols used: Leg Swelling and Edema-A-AH

## 2024-05-06 NOTE — Telephone Encounter (Signed)
 Copied from CRM [id number]. Topic: Clinical - Red Word Triage >> May 05, 2024  2:21 PM Baldomero Bone wrote: Red Word that prompted transfer to Nurse Triage: Patient as taken half a table of traZODone  (DESYREL ) 50 MG tablet twice. Patient's right leg and foot are swollen. She is unsure if it is related. Callback number is [phone] >> May 06, 2024 11:52 AM Everlene Hobby D wrote: Patient hasn't gotten a call back from her pcp and says her leg still hurts . Legs and feet still swollen >> May 06, 2024 10:43 AM CMA Elon Hakim B wrote: Telephone encounter created  >> May 05, 2024  6:00 PM Glenora Laos R wrote: Patient as taken half a table of traZODone  (DESYREL ) 50 MG tablet twice. Patient's right leg and foot are swollen. She is unsure if it is related. Pt wants a call back number is [phone].

## 2024-05-10 NOTE — Telephone Encounter (Signed)
 Patient has a appointment for 6/19 with provider

## 2024-05-12 ENCOUNTER — Encounter: Payer: Self-pay | Admitting: Pediatrics

## 2024-05-12 ENCOUNTER — Ambulatory Visit: Payer: Self-pay | Admitting: Pediatrics

## 2024-05-12 ENCOUNTER — Ambulatory Visit (INDEPENDENT_AMBULATORY_CARE_PROVIDER_SITE_OTHER): Payer: Self-pay | Admitting: Pediatrics

## 2024-05-12 ENCOUNTER — Ambulatory Visit
Admission: RE | Admit: 2024-05-12 | Discharge: 2024-05-12 | Disposition: A | Discharge status: Home / Self Care | Payer: Self-pay | Source: Ambulatory Visit | Attending: Pediatrics | Admitting: Pediatrics

## 2024-05-12 VITALS — BP 145/82 | HR 71 | Temp 98.3°F | Wt 126.0 lb

## 2024-05-12 DIAGNOSIS — M7989 Other specified soft tissue disorders: Secondary | ICD-10-CM | POA: Insufficient documentation

## 2024-05-12 DIAGNOSIS — I82431 Acute embolism and thrombosis of right popliteal vein: Secondary | ICD-10-CM

## 2024-05-12 MED ORDER — APIXABAN (ELIQUIS) VTE STARTER PACK (10MG AND 5MG)
ORAL_TABLET | ORAL | 0 refills | Status: DC
Start: 2024-05-12 — End: 2024-05-21

## 2024-05-12 NOTE — Progress Notes (Signed)
 Office Visit  BP (!) 145/82   Pulse 71   Temp 98.3 F (36.8 C) (Oral)   Wt 126 lb (57.2 kg)   LMP  (LMP Unknown)   SpO2 98%   BMI 20.97 kg/m    Subjective:    Patient ID: Monique [name], female    DOB: 1951-06-06, 73 y.o.   MRN: [id number]  HPI: Monique [name] is a 73 y.o. female  Chief Complaint  Patient presents with   Leg Swelling    Discussed the use of AI scribe software for clinical note transcription with the patient, who gave verbal consent to proceed.  History of Present Illness   Monique [name] is a 73 year old female who presents with leg swelling and sleep disturbances.  She has been experiencing significant leg swelling that appeared suddenly, initially causing her sandals to leave marks on her skin. The swelling has decreased somewhat, but she still experiences sharp pain in the back of her leg when touched. Her ankle was particularly swollen and painful, although the pain has improved. There is no history of recent travel, falls, or injuries, and she confirms no bites or incidents that could have caused the swelling.  She has been experiencing sleep disturbances and reports that her current medication is not effective. She tried taking half a tablet on two separate nights without success. On the third night, she took a full tablet and slept well, describing it as 'slept like a baby'. However, subsequent attempts with a full tablet did not yield the same results. She did not specify the name or dosage of the medication.  No chest pain, shortness of breath, or pain in other areas except for the leg.        Relevant past medical, surgical, family and social history reviewed and updated as indicated. Interim medical history since our last visit reviewed. Allergies and medications reviewed and updated.  ROS per HPI unless specifically indicated above     Objective:    BP (!) 145/82   Pulse 71   Temp 98.3 F (36.8 C) (Oral)   Wt 126 lb (57.2 kg)    LMP  (LMP Unknown)   SpO2 98%   BMI 20.97 kg/m   Wt Readings from Last 3 Encounters:  05/12/24 126 lb (57.2 kg)  04/30/24 126 lb (57.2 kg)  12/30/11 126 lb 1.9 oz (57.2 kg)     Physical Exam Constitutional:      Appearance: Normal appearance.  Pulmonary:     Effort: Pulmonary effort is normal.   Musculoskeletal:        General: Normal range of motion.     Right lower leg: Edema present.     Comments: +homan's sign on right, edematous to midshin, 1+pitting   Skin:    Comments: Normal skin color   Neurological:     General: No focal deficit present.     Mental Status: She is alert. Mental status is at baseline.   Psychiatric:        Mood and Affect: Mood normal.        Behavior: Behavior normal.        Thought Content: Thought content normal.         05/12/2024    2:47 PM 04/30/2024    2:02 PM  Depression screen PHQ 2/9  Decreased Interest 1 0  Down, Depressed, Hopeless 1 0  PHQ - 2 Score 2 0  Altered sleeping 3 3  Tired, decreased energy 2 0  Change  in appetite 1 0  Feeling bad or failure about yourself  0 0  Trouble concentrating 1 3  Moving slowly or fidgety/restless 1 0  Suicidal thoughts 0 0  PHQ-9 Score 10 6  Difficult doing work/chores Somewhat difficult Somewhat difficult       05/12/2024    2:47 PM 04/30/2024    2:02 PM  GAD 7 : Generalized Anxiety Score  Nervous, Anxious, on Edge 2 0  Control/stop worrying 1 0  Worry too much - different things 0 0  Trouble relaxing 1 0  Restless 1 3  Easily annoyed or irritable 1 1  Afraid - awful might happen 0 0  Total GAD 7 Score 6 4  Anxiety Difficulty Somewhat difficult        Assessment & Plan:  Assessment & Plan   Swelling of right lower extremity Acute leg swelling and pain suggest possible DVT despite absence of typical risk factors, +homan sign on right. Discussed risks and benefits of diagnostic tests. Suspect elevated bp from discomfort. - Discuss D-dimer test to rule out DVT opts to proceed  with ultrasound - Recommend ultrasound  -     US  Venous Img Lower Unilateral Right (DVT); Future  Follow up plan: Return if symptoms worsen or fail to improve.  Hadassah Letters, MD

## 2024-05-13 NOTE — Telephone Encounter (Signed)
 Ok for E2C2 to review.  Left message for patient. Please transfer call to clinic and request that I be interrupted if needed.

## 2024-05-17 ENCOUNTER — Telehealth: Payer: Self-pay

## 2024-05-17 NOTE — Telephone Encounter (Signed)
 Copied from CRM [id number]. Topic: Clinical - Prescription Issue >> May 17, 2024 12:57 PM Selinda RAMAN wrote: Reason for CRM: The patient called in stating although she is extremely appreciative for the samples of Eloquis but she was given an amount that will not last for the month. She says the first box contained #14 when it should have contained #28 as she is having to take 2 tablets every 12 hrs or 4 a day. She was given the samples on the 19th. She has roughly 40 pills left but states she will be short to finish out the month. She doesn't have insurance so she cannot afford a prescription of them. Please assist patient further as she would need more samples if there are any available to help her get through the month.   Please advise?

## 2024-05-19 ENCOUNTER — Telehealth: Payer: Self-pay | Admitting: Pediatrics

## 2024-05-19 NOTE — Telephone Encounter (Signed)
 Patient came in with questions concerning some prenatal vitamins she has and wanted to know if its ok for her to take them. Please follow up with her [phone] Liylah

## 2024-05-21 ENCOUNTER — Other Ambulatory Visit: Payer: Self-pay

## 2024-05-21 MED ORDER — APIXABAN 5 MG PO TABS: 5.0000 mg | ORAL_TABLET | Freq: Two times a day (BID) | ORAL | 0 refills | Status: DC

## 2024-05-21 NOTE — Telephone Encounter (Signed)
 Attempted to reach patient no answer left a vm for pt

## 2024-05-21 NOTE — Telephone Encounter (Signed)
 Discussed with patient she can take the prenatal per her PCP.

## 2024-05-21 NOTE — Telephone Encounter (Signed)
 Copied from CRM [id number]. Topic: Clinical - Medication Question >> May 21, 2024 10:25 AM Carlatta H wrote: Reason for CRM: Please call patient back regarding prenatal vitamins

## 2024-05-21 NOTE — Progress Notes (Signed)
   05/21/2024  Patient ID: Monique [name], female   DOB: [birth year], 73 y.o.   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  Patient endorses healthy and active lifestyle, so unlikely that anticoagulation necessary longer than 3-6 months  Assessment/Plan  Medication access/affordability -Patient would not qualify for BMS PAP for Eliquis  -Patient would qualify for [place] [facility] Free Pharmacy program, which supplies Xarelto 20mg  daily -Prescription for Eliquis  5mg  BID sent to patient's pharmacy with free 1 month coupon to at least get patient another month of therapy.  If expected duration of therapy is 3 months, we could contact drug rep to see if we could get another 6 weeks of samples. -Otherwise, I recommend patient apply for [place] [facility] program, and provider change therapy to Xarelto 20mg  once free trial of Eliquis  5mg  is compelted -Coordinating with PCP for solidified plan  Follow-up:  7/1  [name], PharmD, DPLA

## 2024-05-25 ENCOUNTER — Other Ambulatory Visit: Payer: Self-pay

## 2024-05-25 NOTE — Progress Notes (Signed)
   05/25/2024  Patient ID: Monique [name], female   DOB: [birth year], 73 y.o.   MRN: [id number]  Subjective/Objective Patient outreach to follow-up on medication access/affordability   Medication access/affordability -Patient recently found to have DVT and Eliquis  was prescribed, but patient does not have insurance and cannot afford medication -Patient has completed approximately 3 weeks of therapy with Eliquis  5mg  through samples provided; and she recently picked up a 30 day prescription at her pharmacy using free trial coupon provided. 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 Herold advised discontinuing -Patient states medication did improve her mood/spirits, though; and she has noticed feeling more down since being without the medication -Continues to take sertraline 100mg  daily as prescribed by neurologist  Assessment/Plan   Medication access/affordability -Contacted [place] [facility] to request an application for the free pharmacy program be mailed to patient's home  Sleep/Mood -I recommend resuming trazodone  50mg  at bedtime since patient saw improvement in mood with this medication   Follow-up:  7/8 to see if patient has received [place] [facility] application   Monique [name], PharmD, DPLA

## 2024-05-26 ENCOUNTER — Telehealth: Payer: Self-pay | Admitting: Pediatrics

## 2024-05-26 NOTE — Telephone Encounter (Unsigned)
 Copied from CRM [id number]. Topic: Clinical - Medical Advice >> May 26, 2024  2:19 PM Donee H wrote: Reason for CRM: Patient called with concern regarding a possible referral for hematology. Patient states she thinks it's unnecessary. Patient would like to be tested in office wit Dr. Herold for diabetes instead. She also stated she experienced two recent deaths and caused her depression which she just laid in bed for a long period of time. Patient thinks this could be the cause of her having blood clots. She is requesting a follow up with callback due to having issues with MyChart. Callback number [phone]

## 2024-05-31 NOTE — Telephone Encounter (Signed)
 Will leave for Dr. Herold review upon return to office.

## 2024-06-01 ENCOUNTER — Other Ambulatory Visit: Payer: Self-pay

## 2024-06-01 NOTE — Progress Notes (Signed)
   06/01/2024  Patient ID: Monique [name], female   DOB: 1951-04-28, 73 y.o.   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, but patient endorses she does not plan to do so -Patient will need at least another month of anticoagulation, and PCP is okay switching medication to Xarelto 20mg  if patient can get through [facility] Free Pharmacy program -Application for [place] [facility] was mailed to patient's home last Tuesday, but she has not yet received this   Sleep/Mood -Patient recently resumed trazodone  50mg  at bedtime to help with mood -Endorses  medication initially caused swelling in the right foot/ankle area as well as trouble with balance and twitching in the leg; but this has all resolved -Endorses improved mood and some improvement in sleep, which medication was initially prescribed for (but did not help with the first time she used it)   Assessment/Plan   Medication access/affordability -Patient will inform me when she receives the [facility] application; if not received by Friday, she will leave a message to notify me; so I can follow-up with the program Monday   Sleep/Mood -Continue trazodone  50mg  at bedtime   [name], PharmD, DPLA

## 2024-06-01 NOTE — Telephone Encounter (Signed)
 Scheduled for 8/8 @1040am  to discuss

## 2024-06-07 ENCOUNTER — Ambulatory Visit: Payer: Self-pay

## 2024-06-14 ENCOUNTER — Other Ambulatory Visit: Payer: Self-pay | Admitting: Pediatrics

## 2024-06-15 LAB — CBC
Hematocrit: 31.9 % — ABNORMAL LOW (ref 34.0–46.6)
Hemoglobin: 10.3 g/dL — ABNORMAL LOW (ref 11.1–15.9)
MCH: 32 pg (ref 26.6–33.0)
MCHC: 32.3 g/dL (ref 31.5–35.7)
MCV: 99 fL — ABNORMAL HIGH (ref 79–97)
Platelets: 247 x10E3/uL (ref 150–450)
RBC: 3.22 x10E6/uL — ABNORMAL LOW (ref 3.77–5.28)
RDW: 11.3 % — ABNORMAL LOW (ref 11.7–15.4)
WBC: 4.5 x10E3/uL (ref 3.4–10.8)

## 2024-06-15 LAB — COMPREHENSIVE METABOLIC PANEL WITH GFR
ALT: 16 IU/L (ref 0–32)
AST: 17 IU/L (ref 0–40)
Albumin: 3.9 g/dL (ref 3.8–4.8)
Alkaline Phosphatase: 73 IU/L (ref 44–121)
BUN/Creatinine Ratio: 16 (ref 12–28)
BUN: 11 mg/dL (ref 8–27)
Bilirubin Total: 0.4 mg/dL (ref 0.0–1.2)
CO2: 22 mmol/L (ref 20–29)
Calcium: 9.3 mg/dL (ref 8.7–10.3)
Chloride: 106 mmol/L (ref 96–106)
Creatinine, Ser: 0.67 mg/dL (ref 0.57–1.00)
Globulin, Total: 2.3 g/dL (ref 1.5–4.5)
Glucose: 91 mg/dL (ref 70–99)
Potassium: 4.4 mmol/L (ref 3.5–5.2)
Sodium: 140 mmol/L (ref 134–144)
Total Protein: 6.2 g/dL (ref 6.0–8.5)
eGFR: 91 mL/min/1.73 (ref >59)

## 2024-06-15 LAB — LIPID PANEL W/O CHOL/HDL RATIO
Cholesterol, Total: 151 mg/dL (ref 100–199)
HDL: 68 mg/dL (ref >39)
LDL Chol Calc (NIH): 65 mg/dL (ref 0–99)
Triglycerides: 99 mg/dL (ref 0–149)
VLDL Cholesterol Cal: 18 mg/dL (ref 5–40)

## 2024-06-15 LAB — HGB A1C W/O EAG: Hgb A1c MFr Bld: 4.6 % — ABNORMAL LOW (ref 4.8–5.6)

## 2024-06-16 ENCOUNTER — Ambulatory Visit: Payer: Self-pay | Admitting: Pediatrics

## 2024-06-22 ENCOUNTER — Telehealth: Payer: Self-pay

## 2024-06-22 NOTE — Progress Notes (Signed)
   06/22/2024  Patient ID: Monique [name], female   DOB: Jan 29, 1951, 73 y.o.   MRN: [id number]  Incoming call from patient with medication concerns.  [facility] free pharmacy application was faxed in 7/15, but patient has not received any communication around processing of the application.  I contacted the program, and they are current processing applications from 7/13, so they requested a call back Friday.  Patient endorses increased troubles with dizziness and balance affected ability to perform normal daily tasks.  These seems to have started/worsened around the time both Eliquis  and trazodone  were initiated.  I advised the patient to decrease trazodone  to 25mg  nightly x3 nights and stop after x1 week to see if symptoms improve.  I will contact [facility] again Friday to check on processing of her application.  In the meantime, she has 9 days remaining of Eliquis , so I will go ahead and see if the office has any samples of Eliquis  or Xarelto she can get.  Patient sees Dr. Herold Friday of next week and will follow-up with me the previous Thursday in regard to how stopping trazodone  has affected dizziness and balance.  Monique [name], PharmD, DPLA

## 2024-06-25 ENCOUNTER — Telehealth: Payer: Self-pay

## 2024-06-25 NOTE — Progress Notes (Signed)
   06/25/2024  Patient ID: Monique [name], female   DOB: 15-May-1951, 73 y.o.   MRN: [id number]  [facility] Free Pharmacy Program, and patient has been approved through 11/24/2024.  Prescription pending for Xarelto 20mg  daily to replace Eliquis  5mg  BID once patient runs out of samples provided this week from [facility] (received 2 week supply).  Informed patient neurology can also send prescriptions to this program if it would be beneficial to get topiramate and sertraline at no cost, also.  Educated patient on administration and potential side effects of Xarelto 20mg  daily.  Telephone follow-up scheduled in 1 week to see if dizziness has improved any with discontinuation of trazodone .  [name], PharmD, DPLA

## 2024-06-28 MED ORDER — RIVAROXABAN 20 MG PO TABS: 20.0000 mg | ORAL_TABLET | Freq: Every day | ORAL | 0 refills | Status: DC

## 2024-07-01 ENCOUNTER — Other Ambulatory Visit: Payer: Self-pay

## 2024-07-01 NOTE — Progress Notes (Signed)
   07/01/2024  Patient ID: Monique [name], female   DOB: Feb 04, 1951, 74 y.o.   MRN: [id number]  Patient outreach to follow up on management of medications.  -Xarelto  20mg  recently sent to [facility], but patient has not heard anything from the pharmacy in regard to processing/shipping- I will contact the pharmacy to see when she can expect to receive this medication.  She currently has a 2 week supply of Eliquis  5mg  samples remaining -Patient stopped trazodone  last Friday and does report less confusion and balance issues but is having trouble sleeping.  She also reports she is still experiencing anxiety and states neurology was going to switch sertraline to escitalopram , but she was concerned with warning she read in regard to use of that medication in patients older than 65.  Patient has been on sertraline for quite a while, and it is possible efficacy has decreased.  Escitalopram  is another SSRI recommended over some other antidepressants for use in older patient population.  Could change to this medication from sertraline to see if she experiences in benefit in regard to anxiety/mood.  Medication can also cause drowsiness; so taken at night, this may also help with sleep -If changing sertraline to escitalopram , I would not recommend any additional medication changes at this time.  -I would not recommend benzodiazepines or hypnotics to  help with sleep based on dizziness and balance issues -Could consider low dose mirtazapine or quetiapine in the future to help with sleep and anxiety , but these could also increase day time drowsiness, dizziness, and potential for falls  [name], PharmD, DPLA

## 2024-07-02 ENCOUNTER — Telehealth: Payer: Self-pay

## 2024-07-02 ENCOUNTER — Encounter: Payer: Self-pay | Admitting: Pediatrics

## 2024-07-02 ENCOUNTER — Ambulatory Visit (INDEPENDENT_AMBULATORY_CARE_PROVIDER_SITE_OTHER): Payer: Self-pay | Admitting: Pediatrics

## 2024-07-02 VITALS — BP 129/73 | HR 85 | Temp 98.9°F | Wt 118.2 lb

## 2024-07-02 DIAGNOSIS — I82531 Chronic embolism and thrombosis of right popliteal vein: Secondary | ICD-10-CM | POA: Insufficient documentation

## 2024-07-02 DIAGNOSIS — R42 Dizziness and giddiness: Secondary | ICD-10-CM

## 2024-07-02 DIAGNOSIS — F4323 Adjustment disorder with mixed anxiety and depressed mood: Secondary | ICD-10-CM

## 2024-07-02 DIAGNOSIS — D649 Anemia, unspecified: Secondary | ICD-10-CM

## 2024-07-02 DIAGNOSIS — G4700 Insomnia, unspecified: Secondary | ICD-10-CM

## 2024-07-02 MED ORDER — ESCITALOPRAM OXALATE 5 MG PO TABS
5.0000 mg | ORAL_TABLET | Freq: Every day | ORAL | 6 refills | Status: AC
Start: 2024-07-02 — End: ?

## 2024-07-02 NOTE — Telephone Encounter (Signed)
 Copied from CRM [id number]. Topic: Clinical - Prescription Issue >> Jul 02, 2024 11:50 AM Monique [name] wrote: Reason for CRM: patient calling to let Dr Herold know the expiration date on medication   lexapro  use before 05/20/24 So she can call in a prescription for the patient this was discussed in the patients appt today

## 2024-07-02 NOTE — Telephone Encounter (Signed)
 Called and informed patient voiced understanding and no questions or concerns

## 2024-07-02 NOTE — Patient Instructions (Addendum)
 Plan:  Send me a message with the expiration date of the escitalopram  dose you have at home  For the titration off the sertraline: - 1 week - decrease to 100mg   - 2 week - if going ok, decrease to 50mg  and start escitalopram  5mg  - 3 week - this week you can either fully stop sertraline or take half and stay at 25mg  for one more week - week 4 - escitalopram  5mg  daily  If you need more refills of the blood thinner, let us  know

## 2024-07-02 NOTE — Progress Notes (Signed)
 Office Visit  BP 129/73   Pulse 85   Temp 98.9 F (37.2 C) (Oral)   Wt 118 lb 3.2 oz (53.6 kg)   LMP  (LMP Unknown)   SpO2 98%   BMI 19.67 kg/m    Subjective:    Patient ID: Monique [name], female    DOB: 01/05/1951, 73 y.o.   MRN: [id number]  HPI: Monique [name] is a 73 y.o. female  No chief complaint on file.   Discussed the use of AI scribe software for clinical note transcription with the patient, who gave verbal consent to proceed.  History of Present Illness   Monique [name] is a 73 year old female with a history of blood clots who presents with dizziness, confusion, and sleep disturbances.  She experiences episodes of dizziness and confusion, often feeling 'like you're turning to water' and needing to sit or lie down. There have been two significant episodes where she felt she had to lie down and subsequently fell asleep. She struggles with simple tasks such as math and paperwork, stating she 'fumbles' through them.  She has been experiencing anxiety and stress due to multiple personal issues, including health concerns, car problems, and house issues. She lives alone and feels overwhelmed by these responsibilities.  She has a history of blood clots and is currently on blood thinners. She reports experiencing dizziness and confusion and notes that she was recently weaned off trazodone . She was weaned off trazodone , and while some symptoms improved, she still experiences dizziness and confusion. She has difficulty sleeping, often falling asleep only at 4:30 or 5:00 AM.  She has a history of taking sertraline (Zoloft) for anxiety and depression but feels it has lost its effectiveness over time. She is currently taking 150 mg of sertraline daily. She has previously discussed switching to escitalopram  (Lexapro ) with her pharmacist but has not yet started it.  She mentions a family history of cardiovascular issues, including her maternal grandmother's death due to  hardening of the arteries and a fall. She reports that her maternal grandmother died due to hardening of the arteries and a fall.  She takes a supplement containing iron, B12, B6, and folate for anemia, which she has been taking for a long time.      Relevant past medical, surgical, family and social history reviewed and updated as indicated. Interim medical history since our last visit reviewed. Allergies and medications reviewed and updated.  ROS per HPI unless specifically indicated above     Objective:    BP 129/73   Pulse 85   Temp 98.9 F (37.2 C) (Oral)   Wt 118 lb 3.2 oz (53.6 kg)   LMP  (LMP Unknown)   SpO2 98%   BMI 19.67 kg/m   Wt Readings from Last 3 Encounters:  07/05/24 119 lb 2 oz (54 kg)  07/02/24 118 lb 3.2 oz (53.6 kg)  05/12/24 126 lb (57.2 kg)     Physical Exam Constitutional:      Appearance: Normal appearance.  Pulmonary:     Effort: Pulmonary effort is normal.  Musculoskeletal:        General: Normal range of motion.  Skin:    Comments: Normal skin color  Neurological:     General: No focal deficit present.     Mental Status: She is alert. Mental status is at baseline.  Psychiatric:        Mood and Affect: Mood normal.        Behavior: Behavior normal.  Thought Content: Thought content normal.         07/05/2024    1:59 PM 07/02/2024   10:51 AM 05/12/2024    2:47 PM 04/30/2024    2:02 PM  Depression screen PHQ 2/9  Decreased Interest 3 3 1  0  Down, Depressed, Hopeless 2 3 1  0  PHQ - 2 Score 5 6 2  0  Altered sleeping 3 3 3 3   Tired, decreased energy 2 3 2  0  Change in appetite 3 1 1  0  Feeling bad or failure about yourself  0 0 0 0  Trouble concentrating 3 3 1 3   Moving slowly or fidgety/restless 1 1 1  0  Suicidal thoughts 0 0 0 0  PHQ-9 Score 17 17 10 6   Difficult doing work/chores Extremely dIfficult Extremely dIfficult Somewhat difficult Somewhat difficult       07/05/2024    1:59 PM 07/02/2024   10:51 AM 05/12/2024    2:47  PM 04/30/2024    2:02 PM  GAD 7 : Generalized Anxiety Score  Nervous, Anxious, on Edge 3 3 2  0  Control/stop worrying 3 3 1  0  Worry too much - different things 3 3 0 0  Trouble relaxing 3 3 1  0  Restless 2 2 1 3   Easily annoyed or irritable 2 3 1 1   Afraid - awful might happen 1 1 0 0  Total GAD 7 Score 17 18 6 4   Anxiety Difficulty Very difficult Extremely difficult Somewhat difficult        Assessment & Plan:  Assessment & Plan   Adjustment disorder with mixed anxiety and depressed mood Assessment & Plan: Increased anxiety and stress. Long-term sertraline may have reduced efficacy. Considering switch to escitalopram , which is safe and effective in older adults. She is hesitant but open to trying a new medication. - Discuss potential switch from sertraline to escitalopram . - Provide titration schedule for reducing sertraline and starting escitalopram . - Instruct to check expiration date of existing escitalopram  prescription and communicate expiration information to determine if a new prescription is needed.  Orders: -     Escitalopram  Oxalate; Take 1 tablet (5 mg total) by mouth daily.  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 Not currently worried about clots but concerned about other symptoms. Medicaid coverage for blood thinners is in process with two weeks of samples remaining. - Ensure adequate supply of blood thinner samples. - Follow up on Medicaid coverage for blood thinners.   Insomnia, unspecified type Assessment & Plan: Difficulty sleeping, potentially related to anxiety and trazodone  discontinuation. - Consider starting escitalopram  to address anxiety and potentially improve sleep.   Anemia, unspecified type Assessment & Plan: Taking iron supplements and a multivitamin with B12, B6, and folate. Anemia may contribute to dizziness. No recent bleeding  reported. Butter, milk, cheese are absorption inhibitors in very high quantities. Declines further labs so unable to discern type of anemia. - Continue current iron and multivitamin supplementation. - Ensure adequate dietary intake.  Dizziness Ongoing dizziness and confusion attributed to trazodone  use. Symptoms expected to resolve post-discontinuation. Concern for other causes such as anemia. I discussed may need head imaging and referral to neurology but declines. - Monitor symptoms for one more week to assess improvement post-trazodone . - Consider imaging of the head if symptoms persist or worsen.   Follow up plan: Return if symptoms worsen or fail to improve.  Monique  SHAUNNA Nett, MD

## 2024-07-02 NOTE — Progress Notes (Signed)
   07/02/2024  Patient ID: Monique [name], female   DOB: 1951/02/05, 73 y.o.   MRN: [id number]  Contacted [facility] to check on processing of Xarelto  20mg  daily. Pharmacy received the medication, and patient is approved for the program; but they are unable to fill brand name medications for patients eligible for Medicare Part D whether enrolled or not.  I verified that they do not carry dabigatran; the only generic anticoagulant offered through the program is warfarin.  Consulting PCP.  [name], PharmD, DPLA

## 2024-07-05 ENCOUNTER — Ambulatory Visit: Payer: Self-pay

## 2024-07-05 VITALS — BP 122/70 | HR 87 | Ht 65.0 in | Wt 119.1 lb

## 2024-07-05 DIAGNOSIS — K047 Periapical abscess without sinus: Secondary | ICD-10-CM

## 2024-07-05 MED ORDER — AMOXICILLIN 500 MG PO CAPS: 500.0000 mg | ORAL_CAPSULE | Freq: Three times a day (TID) | ORAL | 0 refills | Status: AC

## 2024-07-05 NOTE — Progress Notes (Signed)
 Acute Patient Visit  Physician: Eesa Justiss A Lott Seelbach, MD  Patient: Monique [name] MRN: [id number] DOB: Jul 29, 1951 PCP: Herold Hadassah SQUIBB, MD     Subjective:   Chief Complaint  Patient presents with   Facial Swelling    Started Saturday, swelling on the left side of face with Left ear pain.     HPI: The patient is a 73 y.o. female who presents today for:   Discussed the use of AI scribe software for clinical note transcription with the patient, who gave verbal consent to proceed.  History of Present Illness   Monique [name] is a 73 year old female who presents with swelling on the left side of her face.  Facial swelling and numbness - Swelling on the left side of the face noticed upon waking on Saturday morning - Swelling persists with slight reduction since onset - Associated numbness in the affected area - No fever or chills - Significant pain in the mouth disrupting sleep - Mild neck pain present on the same side as the facial swelling - Over-the-counter numbing gel and extra strength Tylenol provide no relief  Medication history and adverse effects - Currently taking Eliquis  for blood clots and anemia per patient report - Trazodone  recently discontinued due to dizziness and confusion  Relevant medical history - No history of diabetes - No known allergies to antibiotics          ROS:   As noted in the HPI    ASSESMENT/PLAN:  Encounter Diagnoses  Name Primary?   Dental infection Yes    No orders of the defined types were placed in this encounter.   Assessment and Plan    Dental Infection Acute swelling and tenderness on the left side of the mouth, likely due to a dental infection, with significant pain  On blood thinners per patient report , requiring careful management if dental procedures are needed. - Advised immediate contact with a dentist for an emergency appointment. - Recommended over-the-counter numbing gel for temporary relief. -  Advised Tylenol for pain management.    Fu with PCP if further concern.    Note patient denies specifically any allergy to antibiotics on several requests.    OBJECTIVE: Vitals:   07/05/24 1354  BP: 122/70  Pulse: 87  SpO2: 99%  Weight: 119 lb 2 oz (54 kg)  Height: 5' 5 (1.651 m)    Body mass index is 19.82 kg/m.   Physical Exam Vitals reviewed.  Constitutional:      Appearance: Normal appearance. Well-developed with normal weight.  Cardiovascular:     Rate and Rhythm: Normal rate and regular rhythm. Normal heart sounds. Normal peripheral pulses Pulmonary:     Normal breath sounds with normal effort Skin:    General: Skin is warm and dry without noticeable rash. Neurological:     General: No focal deficit present.  Psychiatric:        Mood and Affect: Mood, behavior and cognition normal      ENT:  Significant pain with palpation of gum near 3rd molar.  Facial swelling noted   Allergies Patient is allergic to penicillins.  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  Surgical History Patient  has a past surgical history that includes Breast lumpectomy.  Family History Pateint's family history includes Emphysema in her maternal grandfather; Lung cancer in her maternal uncle; Stomach cancer in her maternal aunt.  Social History Patient  reports that she quit smoking about 12 years ago. Her smoking use included cigarettes. She started smoking about 54 years ago. She has a 33.6 pack-year smoking history. She has never used smokeless tobacco. She reports that she does not drink alcohol and does not use drugs.    07/05/2024

## 2024-07-07 ENCOUNTER — Telehealth: Payer: Self-pay

## 2024-07-07 NOTE — Progress Notes (Signed)
   07/08/2024  Patient ID: Monique [name], female   DOB: 1951/03/06, 73 y.o.   MRN: [id number]  Subjective/Objective Patient outreach to follow-up on medication access/affordability   Medication access/affordability -Patient found to have DVT around 6/20, and Eliquis  was prescribed; but patient does not have insurance and cannot afford medication -Patient was able to use free trial coupon to fill 1 month supply, and has been provided with samples to continue use; she has 1 week supply remaining currently -PCP would like patient to follow-up with hematology regarding duration of therapy based on unprovoked DVT, but patient is concerned with cost of specialist visit and additional labs/tests  -Patient will need at least another month of anticoagulation -Application for [facility] was approved, but program states they cannot provide Xarelto  since patient is medicare eligible- they can only provide generic medications, and they do not have dabigatran, only warfarin -Does not qualify for PAP for any DOAC medications   Sleep/Mood -Patient is currently weaning dose of sertraline and initiating escitalopram  5mg  daily.  Tomorrow patient will go down to sertraline 50mg  and start 5mg  of escitalopram  x1 week, then either stop sertraline or continue for an additional 3 days if needed (based on any withdrawal symptoms) -Trazodone  stopped approximately 2 weeks ago, and patient states she is feeling better.  Goal was to decrease dizziness and decreased balance patient was experiencing.   Assessment/Plan   Medication access/affordability -Advised patient look into Medicare Advantage or Drug plan to assist with affordability of medications.  Likely this will not be affordable since she will have penalties from not enrolling when initially eligible.  If enrolled, copay for DOAC likely to be expensive still until out of pocket max is met -Coordinating with PCP office to secure at least another month of  samples to complete a 3 month duration of Eliquis  therapy -Coordinating with SW to see if they can provide additional information on charity care (would patient qualify to assist with cost of seeing hematology)  Sleep/Mood -Continue sertraline wean and initiation plan for escitalopram  -I will continue to follow-up with patient to monitor efficacy/tolerance of medication change   [name], PharmD, DPLA

## 2024-07-11 ENCOUNTER — Encounter: Payer: Self-pay | Admitting: Pediatrics

## 2024-07-11 DIAGNOSIS — D649 Anemia, unspecified: Secondary | ICD-10-CM | POA: Insufficient documentation

## 2024-07-11 NOTE — Assessment & Plan Note (Signed)
 Taking iron supplements and a multivitamin with B12, B6, and folate. Anemia may contribute to dizziness. No recent bleeding reported. Butter, milk, cheese are absorption inhibitors in very high quantities. Declines further labs so unable to discern type of anemia. - Continue current iron and multivitamin supplementation. - Ensure adequate dietary intake.

## 2024-07-11 NOTE — Assessment & Plan Note (Signed)
 Increased anxiety and stress. Long-term sertraline may have reduced efficacy. Considering switch to escitalopram , which is safe and effective in older adults. She is hesitant but open to trying a new medication. - Discuss potential switch from sertraline to escitalopram . - Provide titration schedule for reducing sertraline and starting escitalopram . - Instruct to check expiration date of existing escitalopram  prescription and communicate expiration information to determine if a new prescription is needed.

## 2024-07-11 NOTE — Assessment & Plan Note (Signed)
 Difficulty sleeping, potentially related to anxiety and trazodone  discontinuation. - Consider starting escitalopram  to address anxiety and potentially improve sleep.

## 2024-07-11 NOTE — Assessment & Plan Note (Signed)
 On blood thinners. Not currently worried about clots but concerned about other symptoms. Medicaid coverage for blood thinners is in process with two weeks of samples remaining. - Ensure adequate supply of blood thinner samples. - Follow up on Medicaid coverage for blood thinners.

## 2024-07-13 ENCOUNTER — Telehealth: Payer: Self-pay

## 2024-07-13 NOTE — Progress Notes (Signed)
   07/13/2024  Patient ID: Monique [name], female   DOB: 08/31/1951, 73 y.o.   MRN: [id number]  Returning missed call/voicemail from patient.  She is down to a 2 day supply of Eliquis  5mg , and went to [facility] to inquire about samples today.  Dr. Herold was not there, so they were not able to provide samples to the patient.    Looked into eligibility for [facility] Extra Help, but patient does not qualify based on resource amounts.  We also looked into the cost of dabigatran 150mg  BID on GoodRx, and this could be an affordable option depending on expected duration of therapy.  I have messaged Dr. Herold to inquire about samples as well as recommended hematology practice, so I can see if they would accept [facility] to help pay for visit and any labs/tests related to visit to determine duration of anticoagulation.  [name], PharmD, DPLA

## 2024-07-14 ENCOUNTER — Telehealth: Payer: Self-pay

## 2024-07-14 NOTE — Progress Notes (Signed)
   07/14/2024  Patient ID: Monique [name], female   DOB: January 26, 1951, 73 y.o.   MRN: [id number]  Patient outreach to notify Monique [name] that [facility] has set aside 6 weeks of Eliquis  5mg  samples for her to pick up at her convenience.  I was not able to reach the patient but did leave a HIPAA compliant voicemail and am sending a MyChart message.  [name], PharmD, DPLA

## 2024-07-21 ENCOUNTER — Other Ambulatory Visit: Payer: Self-pay | Admitting: Pediatrics

## 2024-07-21 DIAGNOSIS — I82531 Chronic embolism and thrombosis of right popliteal vein: Secondary | ICD-10-CM

## 2024-07-21 NOTE — Progress Notes (Signed)
 Referral order placed for hematology for unprovoked DVT.  Monique SHAUNNA Nett, MD

## 2024-08-05 ENCOUNTER — Telehealth: Payer: Self-pay

## 2024-08-05 NOTE — Progress Notes (Signed)
   08/05/2024  Patient ID: Monique [name], female   DOB: [birth year], 73 y.o.   MRN: [id number]  Returning missed call/voicemail from patient in regard to upcoming heme/onc visit Monday 9/15.  Explained to patient that referral was placed to address how long should remain on anticoagulation after recent DVT(s), and this is managed by the same group that patient's see for oncology needs.  Patient voiced concern that she will not have completed 3 months of Eliquis  by time of visit.  Advised that for an initial consult this would not be necessary.  She was able to get a quote for what her visit would cost, but she cannot get a quote for any lab work until it is known what the provider would like to order.  I advised that she consult provider to see if lab work can be done outside of Monday's visit, so she can get a quote on cost once any orders are placed.  Patient endorses understanding.  [name], PharmD, DPLA

## 2024-08-06 ENCOUNTER — Other Ambulatory Visit: Payer: Self-pay | Admitting: Pediatrics

## 2024-08-06 ENCOUNTER — Other Ambulatory Visit: Payer: Self-pay

## 2024-08-06 DIAGNOSIS — Z23 Encounter for immunization: Secondary | ICD-10-CM

## 2024-08-06 MED ORDER — COVID-19 MRNA VAC-TRIS(PFIZER) 30 MCG/0.3ML IM SUSY
0.3000 mL | PREFILLED_SYRINGE | Freq: Once | INTRAMUSCULAR | 0 refills | Status: AC
  Filled 2024-08-06: qty 0.3, 1d supply, fill #0

## 2024-08-06 NOTE — Progress Notes (Signed)
 Sent [facility] to [facility] per mychart request.  Hadassah SHAUNNA Nett, MD

## 2024-08-09 ENCOUNTER — Encounter: Payer: Self-pay | Admitting: Oncology

## 2024-08-09 ENCOUNTER — Inpatient Hospital Stay: Payer: Self-pay

## 2024-08-09 ENCOUNTER — Inpatient Hospital Stay: Payer: Self-pay | Attending: Oncology | Admitting: Oncology

## 2024-08-09 VITALS — BP 118/59 | HR 91 | Temp 99.0°F | Resp 18 | Wt 117.5 lb

## 2024-08-09 DIAGNOSIS — Z800 Family history of malignant neoplasm of digestive organs: Secondary | ICD-10-CM | POA: Insufficient documentation

## 2024-08-09 DIAGNOSIS — Z801 Family history of malignant neoplasm of trachea, bronchus and lung: Secondary | ICD-10-CM | POA: Insufficient documentation

## 2024-08-09 DIAGNOSIS — Z7689 Persons encountering health services in other specified circumstances: Secondary | ICD-10-CM | POA: Insufficient documentation

## 2024-08-09 DIAGNOSIS — Z7901 Long term (current) use of anticoagulants: Secondary | ICD-10-CM | POA: Insufficient documentation

## 2024-08-09 DIAGNOSIS — Z86718 Personal history of other venous thrombosis and embolism: Secondary | ICD-10-CM | POA: Insufficient documentation

## 2024-08-09 DIAGNOSIS — Z87891 Personal history of nicotine dependence: Secondary | ICD-10-CM | POA: Insufficient documentation

## 2024-08-09 DIAGNOSIS — Z8701 Personal history of pneumonia (recurrent): Secondary | ICD-10-CM | POA: Insufficient documentation

## 2024-08-09 DIAGNOSIS — D649 Anemia, unspecified: Secondary | ICD-10-CM | POA: Insufficient documentation

## 2024-08-09 NOTE — Assessment & Plan Note (Addendum)
 History of right lower extremity DVT, unprovoked. I agree with continue anticoagulation with Eliquis  5 mg twice daily. I recommend to repeat ultrasound for follow-up.  Consider long-term Eliquis  2.5 mg twice daily in the future.   Recommend hypercoagulable workup-patient adamantly declined. I recommend to monitor anemia level while she is on anticoagulation.  Patient declined. Recommend patient to keep age-appropriate cancer screening.  She is overdue for colonoscopy.  She declined.

## 2024-08-09 NOTE — Assessment & Plan Note (Signed)
 Hemoglobin 10, MCV 99.  Patient declined workup for anemia.  I recommend patient to take over-the-counter vitamin B12 supplementation.

## 2024-08-09 NOTE — Assessment & Plan Note (Signed)
 Refer to Child psychotherapist.  She has no medical insurance

## 2024-08-09 NOTE — Progress Notes (Signed)
 Hematology/Oncology Consult note [phone] [phone]        REFERRING PROVIDER: Herold Hadassah SQUIBB, MD   CHIEF COMPLAINTS/REASON FOR VISIT:  Evaluation of right lower extremity DVT   ASSESSMENT & PLAN:   History of DVT (deep vein thrombosis) History of right lower extremity DVT, unprovoked. I agree with continue anticoagulation with Eliquis  5 mg twice daily. I recommend to repeat ultrasound for follow-up.  Consider long-term Eliquis  2.5 mg twice daily in the future.   Recommend hypercoagulable workup-patient adamantly declined. I recommend to monitor anemia level while she is on anticoagulation.  Patient declined. Recommend patient to keep age-appropriate cancer screening.  She is overdue for colonoscopy.  She declined.  Anemia Hemoglobin 10, MCV 99.  Patient declined workup for anemia.  I recommend patient to take over-the-counter vitamin B12 supplementation.  Encounter for social work Statistician to Child psychotherapist.  She has no medical insurance   Follow-up after ultrasound.  All questions were answered. The patient knows to call the clinic with any problems, questions or concerns.  Zelphia Cap, MD, PhD [facility] Hematology Oncology 08/09/2024   HISTORY OF PRESENTING ILLNESS:   Monique [name] is a  73 y.o.  female with PMH listed below was seen in consultation at the request of  Herold Hadassah SQUIBB, MD  for evaluation of left lower extremity DVT  Discussed the use of AI scribe software for clinical note transcription with the patient, who gave verbal consent to proceed.    She has a history of deep vein thrombosis (DVT) diagnosed in June 2025 after experiencing right calf swelling. At the time, she was taking trazodone  and sertraline, which coincided with the onset of swelling. An ultrasound confirmed blood clots in the right lower extremity, and she was started on Eliquis , currently on 5 mg twice daily. The swelling improved after discontinuing  trazodone  but recurred upon resumption; she reported improvement in symptoms after stopping trazodone  again.  No prior symptoms of DVT before the June 2025 ultrasound. No recent long-distance travel, surgery, or immobilization before the event, but she mentions a period of prolonged immobility due to grieving a personal loss earlier in the year. She became active again around March 2025, engaging in gardening and other physical activities.  She is anemic, though the cause is undetermined as she declined further blood work. She has been trying to manage her anemia through dietary modifications, including more greens and legumes. No alcohol use, smoking, or drug use.  She has been working with a Teacher, music to find an affordable blood thinner due to the high cost of Eliquis , as she does not have insurance. She has been approved for a different blood thinner at a lower cost, though she cannot recall the name. She has a supply of Eliquis  that will last less than three months.  Family history includes an uncle who died of lung cancer attributed to smoking and an aunt who had stomach cancer, possibly related to occupational exposure to x-rays.     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    SURGICAL HISTORY: Past Surgical History:  Procedure Laterality Date   BREAST LUMPECTOMY     Rt breast    SOCIAL HISTORY: Social History   Socioeconomic History   Marital status: Unknown    Spouse name: Not on file   Number of children: 1  Years of education: Not on file   Highest education level: Not on file  Occupational History   Occupation: Unemployed  Tobacco Use   Smoking status: Former    Current packs/day: 0.00    Average packs/day: 0.8 packs/day for 42.0 years (33.6 ttl pk-yrs)    Types: Cigarettes    Start date: 12/01/1969    Quit date: 12/02/2011     Years since quitting: 12.6   Smokeless tobacco: Never  Substance and Sexual Activity   Alcohol use: No   Drug use: No   Sexual activity: Not on file  Other Topics Concern   Not on file  Social History Narrative   Not on file   Social Drivers of Health   Financial Resource Strain: Not on file  Food Insecurity: No Food Insecurity (08/09/2024)   Hunger Vital Sign    Worried About Running Out of Food in the Last Year: Never true    Ran Out of Food in the Last Year: Never true  Transportation Needs: No Transportation Needs (08/09/2024)   PRAPARE - Administrator, Civil Service (Medical): No    Lack of Transportation (Non-Medical): No  Physical Activity: Not on file  Stress: Not on file  Social Connections: Not on file  Intimate Partner Violence: Not At Risk (08/09/2024)   Humiliation, Afraid, Rape, and Kick questionnaire    Fear of Current or Ex-Partner: No    Emotionally Abused: No    Physically Abused: No    Sexually Abused: No    FAMILY HISTORY: Family History  Problem Relation Age of Onset   Emphysema Maternal Grandfather        smoked a pipe   Stomach cancer Maternal Aunt    Lung cancer Maternal Uncle        was a smoker    ALLERGIES:  is allergic to penicillins.  MEDICATIONS:  Current Outpatient Medications  Medication Sig Dispense Refill   apixaban  (ELIQUIS ) 5 MG TABS tablet Take 5 mg by mouth 2 (two) times daily.     escitalopram  (LEXAPRO ) 5 MG tablet Take 1 tablet (5 mg total) by mouth daily. 30 tablet 6   Prenatal MV-Min-Fe Fum-FA-DHA (PRENATAL/FOLIC ACID+DHA PO) Take 1 tablet by mouth daily.     topiramate (TOPAMAX) 50 MG tablet Take 1 tablet by mouth daily.     No current facility-administered medications for this visit.    Review of Systems  Constitutional:  Negative for appetite change, chills, fatigue and fever.  HENT:   Negative for hearing loss and voice change.   Eyes:  Negative for eye problems.  Respiratory:  Negative for chest  tightness and cough.   Cardiovascular:  Positive for leg swelling. Negative for chest pain.  Gastrointestinal:  Negative for abdominal distention, abdominal pain and blood in stool.  Endocrine: Negative for hot flashes.  Genitourinary:  Negative for difficulty urinating and frequency.   Musculoskeletal:  Negative for arthralgias.  Skin:  Negative for itching and rash.  Neurological:  Negative for extremity weakness.  Hematological:  Negative for adenopathy.  Psychiatric/Behavioral:  Negative for confusion.    PHYSICAL EXAMINATION:  Vitals:   08/09/24 1507  BP: (!) 118/59  Pulse: 91  Resp: 18  Temp: 99 F (37.2 C)   Filed Weights   08/09/24 1507  Weight: 117 lb 8 oz (53.3 kg)    Physical Exam Constitutional:      General: She is not in acute distress. HENT:     Head: Normocephalic and atraumatic.  Eyes:     General: No scleral icterus. Cardiovascular:     Rate and Rhythm: Normal rate and regular rhythm.     Heart sounds: Normal heart sounds.  Pulmonary:     Effort: Pulmonary effort is normal. No respiratory distress.     Breath sounds: No wheezing.  Abdominal:     General: Bowel sounds are normal. There is no distension.     Palpations: Abdomen is soft.  Musculoskeletal:        General: No deformity. Normal range of motion.     Cervical back: Normal range of motion and neck supple.  Skin:    General: Skin is warm and dry.     Findings: No erythema or rash.  Neurological:     Mental Status: She is alert and oriented to person, place, and time. Mental status is at baseline.  Psychiatric:        Mood and Affect: Mood normal.     LABORATORY DATA:  I have reviewed the data as listed    Latest Ref Rng & Units 06/14/2024    1:43 PM 12/09/2011    8:34 AM 12/07/2011    5:12 AM  CBC  WBC 3.4 - 10.8 x10E3/uL 4.5   11.5   Hemoglobin 11.1 - 15.9 g/dL 89.6  89.5  8.6   Hematocrit 34.0 - 46.6 % 31.9  30.7  25.1   Platelets 150 - 450 x10E3/uL 247   424       Latest  Ref Rng & Units 06/14/2024    1:43 PM 12/09/2011    8:34 AM 12/07/2011    5:12 AM  CMP  Glucose 70 - 99 mg/dL 91   845   BUN 8 - 27 mg/dL 11   11   Creatinine 9.42 - 1.00 mg/dL 9.32   9.41   Sodium 865 - 144 mmol/L 140   145   Potassium 3.5 - 5.2 mmol/L 4.4  2.8  3.0   Chloride 96 - 106 mmol/L 106   101   CO2 20 - 29 mmol/L 22   33   Calcium 8.7 - 10.3 mg/dL 9.3   8.5   Total Protein 6.0 - 8.5 g/dL 6.2     Total Bilirubin 0.0 - 1.2 mg/dL 0.4     Alkaline Phos 44 - 121 IU/L 73     AST 0 - 40 IU/L 17     ALT 0 - 32 IU/L 16         RADIOGRAPHIC STUDIES: I have personally reviewed the radiological images as listed and agreed with the findings in the report. US  Venous Img Lower Unilateral Right (DVT) Result Date: 05/12/2024 CLINICAL DATA:  Right lower extremity swelling EXAM: Right LOWER EXTREMITY VENOUS DOPPLER ULTRASOUND TECHNIQUE: Gray-scale sonography with graded compression, as well as color Doppler and duplex ultrasound were performed to evaluate the lower extremity deep venous systems from the level of the common femoral vein and including the common femoral, femoral, profunda femoral, popliteal and calf veins including the posterior tibial, peroneal and gastrocnemius veins when visible. The superficial great saphenous vein was also interrogated. Spectral Doppler was utilized to evaluate flow at rest and with distal augmentation maneuvers in the common femoral, femoral and popliteal veins. COMPARISON:  None Available. FINDINGS: Contralateral Common Femoral Vein: Respiratory phasicity is normal and symmetric with the symptomatic side. No evidence of thrombus. Normal compressibility. Common Femoral Vein: No evidence of thrombus. Normal compressibility, respiratory phasicity and response to augmentation. Saphenofemoral Junction: No evidence of  thrombus. Normal compressibility and flow on color Doppler imaging. Profunda Femoral Vein: No evidence of thrombus. Normal compressibility and flow on  color Doppler imaging. Femoral Vein: No evidence of thrombus. Normal compressibility, respiratory phasicity and response to augmentation. Popliteal Vein: Positive for occlusive thrombus.  Noncompressible Calf Veins: Positive for occlusive thrombus.  Noncompressible IMPRESSION: Positive for occlusive DVT in the right popliteal and calf veins. Electronically Signed   By: Luke Bun M.D.   On: 05/12/2024 17:20

## 2024-08-10 ENCOUNTER — Inpatient Hospital Stay: Payer: Self-pay

## 2024-08-10 NOTE — Progress Notes (Signed)
 [facility] Clinical Social Work  Initial Assessment   Monique [name] is a 73 y.o. year old female contacted by phone. Clinical Social Work was referred by medical provider for financial concerns.   SDOH (Social Determinants of Health) assessments performed: Yes SDOH Interventions    Flowsheet Row Office Visit from 07/02/2024 in [facility]  SDOH Interventions   Depression Interventions/Treatment  Currently on Treatment    SDOH Screenings   Food Insecurity: No Food Insecurity (08/09/2024)  Housing: Low Risk  (08/09/2024)  Transportation Needs: No Transportation Needs (08/09/2024)  Utilities: Not At Risk (08/09/2024)  Depression (PHQ2-9): Low Risk  (08/09/2024)  Recent Concern: Depression (PHQ2-9) - High Risk (07/05/2024)  Tobacco Use: Medium Risk (08/09/2024)    PHQ 2/9:    08/09/2024    3:06 PM 08/09/2024    3:03 PM 07/05/2024    1:59 PM  Depression screen PHQ 2/9  Decreased Interest 0 0 3  Down, Depressed, Hopeless 0 0 2  PHQ - 2 Score 0 0 5  Altered sleeping   3  Tired, decreased energy   2  Change in appetite   3  Feeling bad or failure about yourself    0  Trouble concentrating   3  Moving slowly or fidgety/restless   1  Suicidal thoughts   0  PHQ-9 Score   17  Difficult doing work/chores   Extremely dIfficult     Distress Screen completed: No    08/09/2024    3:01 PM  ONCBCN DISTRESS SCREENING  Screening Type Initial Screening  How much distress have you been experiencing in the past week? (0-10) 0      Family/Social Information:  Housing Arrangement: patient lives alone Family members/support persons in your life? Family.  Patient has family in [place] but does not wish to contact hem. 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 Advanced directives: No Services Currently in place:  Medicare Part A.  Coping/ Adjustment to diagnosis: Patient understands treatment plan and what happens next? yes Concerns about diagnosis and/or treatment: How I will pay for the services I need Patient reported stressors: Finances Hopes and/or priorities: To afford medical care. Patient enjoys being outside and reading Current coping skills/ strengths: Capable of independent living , Communication skills , General fund of knowledge , and Special hobby/interest     SUMMARY: Current SDOH Barriers:  Financial constraints related to fixed income.  Clinical Social Work Clinical Goal(s):  Explore community resource options for unmet needs related to:  Financial Strain   Interventions: Discussed common feeling and emotions when being diagnosed with cancer, and the importance of support during treatment Informed patient of the support team roles and support services at [facility] Provided CSW contact information and encouraged patient to call with any questions or concerns Referred patient to community resources: [employer] Patient Clinical biochemist.  Patient asked for a discount on medical services because her Medicare Part A is limited.  Patient did not wish to receive the food pantry list at this time.  She is not eligible for the [place] [facility] due to not receiving cancer treatment.    Follow Up Plan: Patient will contact CSW with any support or resource needs Patient verbalizes understanding of plan: Yes    Macario CHRISTELLA Au, LCSW Clinical Social Worker [facility]

## 2024-08-16 ENCOUNTER — Encounter: Payer: Self-pay | Admitting: Oncology

## 2024-08-17 ENCOUNTER — Telehealth: Payer: Self-pay | Admitting: *Deleted

## 2024-08-17 NOTE — Telephone Encounter (Signed)
 Wants to know what it is and it is an ultrasound for [place] and she has an appointment for 230 but they want to bring her in 15 minutes early and then on October 20 she she will come to see Dr. Babara at 1 PM.  She wants to know how much is going to cost for the ultrasound and I have no idea.  Told her I would get back with her

## 2024-08-18 NOTE — Telephone Encounter (Signed)
 Scheduled

## 2024-08-19 ENCOUNTER — Telehealth: Payer: Self-pay | Admitting: *Deleted

## 2024-08-19 ENCOUNTER — Ambulatory Visit (INDEPENDENT_AMBULATORY_CARE_PROVIDER_SITE_OTHER): Payer: Self-pay | Admitting: Pediatrics

## 2024-08-19 ENCOUNTER — Encounter: Payer: Self-pay | Admitting: Pediatrics

## 2024-08-19 VITALS — BP 111/63 | HR 87 | Ht 65.0 in | Wt 116.8 lb

## 2024-08-19 DIAGNOSIS — F4323 Adjustment disorder with mixed anxiety and depressed mood: Secondary | ICD-10-CM

## 2024-08-19 DIAGNOSIS — I82531 Chronic embolism and thrombosis of right popliteal vein: Secondary | ICD-10-CM

## 2024-08-19 DIAGNOSIS — G4700 Insomnia, unspecified: Secondary | ICD-10-CM

## 2024-08-19 DIAGNOSIS — G2581 Restless legs syndrome: Secondary | ICD-10-CM

## 2024-08-19 MED ORDER — DOXEPIN HCL 6 MG PO TABS: 3.0000 mg | ORAL_TABLET | Freq: Every evening | ORAL | 2 refills | Status: DC | PRN

## 2024-08-19 NOTE — Progress Notes (Signed)
 Office Visit  BP 111/63 (BP Location: Left Arm, Patient Position: Sitting, Cuff Size: Normal)   Pulse 87   Ht 5' 5 (1.651 m)   Wt 116 lb 12.8 oz (53 kg)   LMP  (LMP Unknown)   SpO2 100%   BMI 19.44 kg/m    Subjective:    Patient ID: Monique [name], female    DOB: 1951/03/28, 73 y.o.   MRN: [id number]  HPI: Monique [name] is a 73 y.o. female  Chief Complaint  Patient presents with   Insomnia    Has been a problem for months, going to bed, not able to fall asleep, so doing activities, then go to sleep for 1 hr and get back up and repeat all throughout the night, has tried OTC sleep aides such as meatonin and benadryl but has been ineffective     #insomnia Ongoing issue  Previous unsuccessful trial of trazodone  Has tried behavioral modifications to help none work Has rest legs leg as well that contributes  #DVT #psychosocial Hematology recommending repeat ultrasound. She is inquiring about the price of the upcoming ultrasound  #mood Escitalopram  5mg  going well Feels she is able to have a sense of humor in the context of several stressors the last few months She is concerned this medication worsened her RLS Prefers to stay on it for now  #poor appetite Has had some weight loss, in setting of recent tooth extraction. See trend below Wt Readings from Last 3 Encounters:  08/19/24 116 lb 12.8 oz (53 kg)  08/09/24 117 lb 8 oz (53.3 kg)  07/05/24 119 lb 2 oz (54 kg)    #previous imaging Had imaging in the 90s showing liver mass, thought to be benign She wanted us  to have this updated record  Relevant past medical, surgical, family and social history reviewed and updated as indicated. Interim medical history since our last visit reviewed. Allergies and medications reviewed and updated.  ROS per HPI unless specifically indicated above     Objective:    BP 111/63 (BP Location: Left Arm, Patient Position: Sitting, Cuff Size: Normal)   Pulse 87   Ht 5' 5 (1.651  m)   Wt 116 lb 12.8 oz (53 kg)   LMP  (LMP Unknown)   SpO2 100%   BMI 19.44 kg/m   Wt Readings from Last 3 Encounters:  08/19/24 116 lb 12.8 oz (53 kg)  08/09/24 117 lb 8 oz (53.3 kg)  07/05/24 119 lb 2 oz (54 kg)     Physical Exam Constitutional:      Appearance: Normal appearance.  Pulmonary:     Effort: Pulmonary effort is normal.  Musculoskeletal:        General: Normal range of motion.  Skin:    Comments: Normal skin color  Neurological:     General: No focal deficit present.     Mental Status: She is alert. Mental status is at baseline.  Psychiatric:        Mood and Affect: Mood normal.        Behavior: Behavior normal.        Thought Content: Thought content normal.         08/09/2024    3:06 PM 08/09/2024    3:03 PM 07/05/2024    1:59 PM 07/02/2024   10:51 AM 05/12/2024    2:47 PM  Depression screen PHQ 2/9  Decreased Interest 0 0 3 3 1   Down, Depressed, Hopeless 0 0 2 3 1   PHQ - 2  Score 0 0 5 6 2   Altered sleeping   3 3 3   Tired, decreased energy   2 3 2   Change in appetite   3 1 1   Feeling bad or failure about yourself    0 0 0  Trouble concentrating   3 3 1   Moving slowly or fidgety/restless   1 1 1   Suicidal thoughts   0 0 0  PHQ-9 Score   17 17 10   Difficult doing work/chores   Extremely dIfficult Extremely dIfficult Somewhat difficult       07/05/2024    1:59 PM 07/02/2024   10:51 AM 05/12/2024    2:47 PM 04/30/2024    2:02 PM  GAD 7 : Generalized Anxiety Score  Nervous, Anxious, on Edge 3 3 2  0  Control/stop worrying 3 3 1  0  Worry too much - different things 3 3 0 0  Trouble relaxing 3 3 1  0  Restless 2 2 1 3   Easily annoyed or irritable 2 3 1 1   Afraid - awful might happen 1 1 0 0  Total GAD 7 Score 17 18 6 4   Anxiety Difficulty Very difficult Extremely difficult Somewhat difficult        Assessment & Plan:  Assessment & Plan   Insomnia, unspecified type Assessment & Plan: Chronic issue. Continue lifestyle modifications. RLS  contributing to some degree. Plan to trial doxepin  3-6mg  at bedtime prn.  Orders: -     Doxepin  HCl; Take 0.5-1 tablets (3-6 mg total) by mouth at bedtime as needed (insomnia).  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 Recommending repeat ultrasound. Pt declined hypercoag and malignancy workup. Will message team regarding pricing question.   Adjustment disorder with mixed anxiety and depressed mood Assessment & Plan: Lexapro  5mg  helping, declines increase in case contributing to insomnia/worsened RLS. Continue to monitor.   Restless leg syndrome Assessment & Plan: Chronic. H/o iron deficiency, taking prenatal vitamins. Declined recent anemia work up but suspect iron supplementation would be beneficial given symptoms. Will trial doxepin  as discuss above.      Follow up plan: Return if symptoms worsen or fail to improve.  Hadassah SHAUNNA Nett, MD

## 2024-08-19 NOTE — Assessment & Plan Note (Signed)
 Recent evaluation with hematology. Recommending repeat ultrasound. Pt declined hypercoag and malignancy workup. Will message team regarding pricing question.

## 2024-08-19 NOTE — Assessment & Plan Note (Signed)
 Lexapro  5mg  helping, declines increase in case contributing to insomnia/worsened RLS. Continue to monitor.

## 2024-08-19 NOTE — Assessment & Plan Note (Addendum)
 Chronic. H/o iron deficiency, taking prenatal vitamins. Declined recent anemia work up but suspect iron supplementation would be beneficial given symptoms. Will trial doxepin  as discuss above.

## 2024-08-19 NOTE — Telephone Encounter (Signed)
 I called Mr. Wilz this morning and I told her the only thing that I got about how much cost is now the patient needs to call their insurance and tell them what kind of ultrasound into the bilateral lower legs and asked how much that cost for the patient. I left the message on voicemail because she did not answer.

## 2024-08-19 NOTE — Patient Instructions (Signed)
 Plan to start doxepin  3mg -6mg  as needed for sleep. If not working after 2 weeks, message me and we can try another

## 2024-08-19 NOTE — Assessment & Plan Note (Signed)
 Chronic issue. Continue lifestyle modifications. RLS contributing to some degree. Plan to trial doxepin  3-6mg  at bedtime prn.

## 2024-08-24 ENCOUNTER — Other Ambulatory Visit: Payer: Self-pay | Admitting: Pediatrics

## 2024-08-24 ENCOUNTER — Telehealth: Payer: Self-pay

## 2024-08-24 MED ORDER — DABIGATRAN ETEXILATE MESYLATE 150 MG PO CAPS: 150.0000 mg | ORAL_CAPSULE | Freq: Two times a day (BID) | ORAL | 0 refills | Status: DC

## 2024-08-24 NOTE — Progress Notes (Signed)
   08/24/2024  Patient ID: Monique [name], female   DOB: [birth year], 73 y.o.   MRN: [id number]  Patient will run out of Eliquis  5mg  BID samples 10/4 and does not follow-up with Dr. Babara until 10/22.  Dr. Babara is in agreement with patient switching to dabigatran 150mg  BID once Eliquis  is completed based on affordability.  Prescription with GoodRx coupon information pending to go to [facility] in [place] for Dr. Herold to sign if in agreement.  This is for a 30 day supply to get patient through to follow-up with hematology.  [name], PharmD, DPLA

## 2024-08-26 NOTE — Telephone Encounter (Signed)
 Duplicate request.  Requested Prescriptions  Pending Prescriptions Disp Refills   dabigatran (PRADAXA) 150 MG CAPS capsule [Pharmacy Med Name: DABIGATRAN 150MG  CAPSULES] 180 capsule     Sig: TAKE 1 CAPSULE(150 MG) BY MOUTH TWICE DAILY     There is no refill protocol information for this order

## 2024-08-27 ENCOUNTER — Telehealth: Payer: Self-pay

## 2024-08-27 NOTE — Progress Notes (Signed)
   08/27/2024  Patient ID: Monique [name], female   DOB: Mar 27, 1951, 73 y.o.   MRN: [id number]  Missed call/voicemail from patient endorsing frustration due to difficulty to sleep with no resolve from current medications.  Patient inquiring about lab work/studies to assess any underlying issue.  I would recommend a follow-up with Dr. Herold to discuss further; referral for sleep study may be beneficial.  Jvion Turgeon A Franco Duley, PharmD, DPLA

## 2024-08-30 ENCOUNTER — Telehealth: Payer: Self-pay

## 2024-08-30 NOTE — Progress Notes (Signed)
   08/30/2024  Patient ID: Monique [name], female   DOB: 09-11-1951, 73 y.o.   MRN: [id number]  Returning missed call/voicemail from patient from earlier today.  Patient states she completed Ellquis 5mg  BID and started dabigatran 150mg  BID 2 days ago.  She states her stools have been dark, but yesterday she noticed they were a lighter color brown with some rosy color she suspects may be blood yesterday and today.  She does not endorse any recent changes to diet and states she does not eat or drink food/beverages with red color.  She does note that over the past 7-10 days she has been taking 1-2 generic Unisom tablets some nights to try to help with sleep.  These tablets are pink in color; and based on results I have found likely contain a red dye that could alter the color of urine and stool.  Patient states Unisom is not helping with sleep, so I have advised her to stop taking this medication; and we have a telephone visit schedule for Thursday to see if bowel movements have returned to normal.  I advised the patient to call if more red color were to appear in stool before that appointment.  [name], PharmD, DPLA

## 2024-09-01 ENCOUNTER — Telehealth: Payer: Self-pay | Admitting: *Deleted

## 2024-09-01 NOTE — Telephone Encounter (Signed)
 SABRA

## 2024-09-01 NOTE — Telephone Encounter (Signed)
 Spoke to pt and US  was moved up to 10/9 at 11a. MD appt has been r/s to 10/10. Pt aware of appts.

## 2024-09-01 NOTE — Telephone Encounter (Signed)
 The patient called in stating that she is going off of Eliquis  and going to be on the ABIGATRAN.  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  The patient said that she talked to [facility] and she had some rosey color in the stool but then a couple days later it looks like red with the stool and she wonders if it is now he wants to see Dr. Babara redness with stool.

## 2024-09-02 ENCOUNTER — Ambulatory Visit
Admission: RE | Admit: 2024-09-02 | Discharge: 2024-09-02 | Disposition: A | Discharge status: Home / Self Care | Payer: Self-pay | Source: Ambulatory Visit | Attending: Oncology | Admitting: Oncology

## 2024-09-02 ENCOUNTER — Other Ambulatory Visit: Payer: Self-pay

## 2024-09-02 DIAGNOSIS — Z86718 Personal history of other venous thrombosis and embolism: Secondary | ICD-10-CM | POA: Insufficient documentation

## 2024-09-02 NOTE — Progress Notes (Signed)
   09/02/2024  Patient ID: Monique [name], female   DOB: [birth year], 73 y.o.   MRN: [id number]  Patient outreach to follow-up on mediation management.  Patient continues to notice rosy/red color in stool.  Imaging studies were moved up to today by hematology, and patient sees Dr. Babara again tomorrow to discuss results and duration of anticoagulation with dabigatran 150mg  BID.  Patient was seen by neurology yesterday and lab work for Magnesium, Ferritin, and B12 were done.  Lauraine Rocks, PA with [facility] recommends the following based on the result of the lab work: -Start OTC Ferrous sulfate 325mg  daily MWF -take with OJ or Vit C- separate from calcium containing products -Start OTC Vitamin B12 1000mcg tablets by mouth daily.  She also recommends B12 1000mcg/mL injection once a week x3 weeks -Have follow-up lab work in 6-8 weeks Neurology states B12 can be given at their office or her PCP office, but patient is open to getting this medication at her local pharmacy and doing injections herself if more cost effective.  She would prefer this to be prescribed by and follow-up lab work evaluated by Dr. Herold.  Coordinating with provider to see if in agreement.  [name], PharmD, DPLA

## 2024-09-03 ENCOUNTER — Telehealth: Payer: Self-pay

## 2024-09-03 ENCOUNTER — Other Ambulatory Visit: Payer: Self-pay

## 2024-09-03 ENCOUNTER — Inpatient Hospital Stay: Payer: Self-pay

## 2024-09-03 ENCOUNTER — Inpatient Hospital Stay: Payer: Self-pay | Admitting: Oncology

## 2024-09-03 ENCOUNTER — Inpatient Hospital Stay
Admission: EM | Admit: 2024-09-03 | Discharge: 2024-09-13 | DRG: 330 | Disposition: A | Discharge status: Home Health | Payer: Self-pay | Attending: Internal Medicine | Admitting: Internal Medicine

## 2024-09-03 DIAGNOSIS — E876 Hypokalemia: Secondary | ICD-10-CM | POA: Diagnosis present

## 2024-09-03 DIAGNOSIS — K921 Melena: Secondary | ICD-10-CM | POA: Diagnosis present

## 2024-09-03 DIAGNOSIS — C180 Malignant neoplasm of cecum: Secondary | ICD-10-CM | POA: Diagnosis present

## 2024-09-03 DIAGNOSIS — K449 Diaphragmatic hernia without obstruction or gangrene: Secondary | ICD-10-CM | POA: Diagnosis present

## 2024-09-03 DIAGNOSIS — K922 Gastrointestinal hemorrhage, unspecified: Principal | ICD-10-CM | POA: Diagnosis present

## 2024-09-03 DIAGNOSIS — Z880 Allergy status to penicillin: Secondary | ICD-10-CM | POA: Diagnosis not present

## 2024-09-03 DIAGNOSIS — D6832 Hemorrhagic disorder due to extrinsic circulating anticoagulants: Secondary | ICD-10-CM | POA: Diagnosis present

## 2024-09-03 DIAGNOSIS — D509 Iron deficiency anemia, unspecified: Secondary | ICD-10-CM | POA: Diagnosis present

## 2024-09-03 DIAGNOSIS — Z86718 Personal history of other venous thrombosis and embolism: Secondary | ICD-10-CM | POA: Diagnosis not present

## 2024-09-03 DIAGNOSIS — Z79899 Other long term (current) drug therapy: Secondary | ICD-10-CM

## 2024-09-03 DIAGNOSIS — Z560 Unemployment, unspecified: Secondary | ICD-10-CM

## 2024-09-03 DIAGNOSIS — D62 Acute posthemorrhagic anemia: Secondary | ICD-10-CM | POA: Insufficient documentation

## 2024-09-03 DIAGNOSIS — T45525A Adverse effect of antithrombotic drugs, initial encounter: Secondary | ICD-10-CM | POA: Diagnosis present

## 2024-09-03 DIAGNOSIS — G4700 Insomnia, unspecified: Secondary | ICD-10-CM | POA: Diagnosis present

## 2024-09-03 DIAGNOSIS — Z681 Body mass index (BMI) 19 or less, adult: Secondary | ICD-10-CM | POA: Diagnosis not present

## 2024-09-03 DIAGNOSIS — E871 Hypo-osmolality and hyponatremia: Secondary | ICD-10-CM | POA: Insufficient documentation

## 2024-09-03 DIAGNOSIS — G2581 Restless legs syndrome: Secondary | ICD-10-CM | POA: Diagnosis present

## 2024-09-03 DIAGNOSIS — Z7901 Long term (current) use of anticoagulants: Secondary | ICD-10-CM | POA: Diagnosis not present

## 2024-09-03 DIAGNOSIS — K2980 Duodenitis without bleeding: Secondary | ICD-10-CM | POA: Diagnosis present

## 2024-09-03 DIAGNOSIS — D490 Neoplasm of unspecified behavior of digestive system: Secondary | ICD-10-CM

## 2024-09-03 DIAGNOSIS — E440 Moderate protein-calorie malnutrition: Secondary | ICD-10-CM | POA: Diagnosis present

## 2024-09-03 DIAGNOSIS — C772 Secondary and unspecified malignant neoplasm of intra-abdominal lymph nodes: Secondary | ICD-10-CM | POA: Diagnosis present

## 2024-09-03 DIAGNOSIS — Z87891 Personal history of nicotine dependence: Secondary | ICD-10-CM

## 2024-09-03 DIAGNOSIS — F4323 Adjustment disorder with mixed anxiety and depressed mood: Secondary | ICD-10-CM | POA: Diagnosis present

## 2024-09-03 DIAGNOSIS — Z5971 Insufficient health insurance coverage: Secondary | ICD-10-CM

## 2024-09-03 LAB — BASIC METABOLIC PANEL WITH GFR
Anion gap: 14 (ref 5–15)
BUN: 19 mg/dL (ref 8–23)
CO2: 22 mmol/L (ref 22–32)
Calcium: 8.4 mg/dL — ABNORMAL LOW (ref 8.9–10.3)
Chloride: 103 mmol/L (ref 98–111)
Creatinine, Ser: 0.7 mg/dL (ref 0.44–1.00)
GFR, Estimated: >60 mL/min (ref >60)
Glucose, Bld: 127 mg/dL — ABNORMAL HIGH (ref 70–99)
Potassium: 3.5 mmol/L (ref 3.5–5.1)
Sodium: 139 mmol/L (ref 135–145)

## 2024-09-03 LAB — HEPATIC FUNCTION PANEL
ALT: 16 U/L (ref 0–44)
AST: 21 U/L (ref 15–41)
Albumin: 2.8 g/dL — ABNORMAL LOW (ref 3.5–5.0)
Alkaline Phosphatase: 46 U/L (ref 38–126)
Bilirubin, Direct: <0.1 mg/dL (ref 0.0–0.2)
Total Bilirubin: 0.4 mg/dL (ref 0.0–1.2)
Total Protein: 5.4 g/dL — ABNORMAL LOW (ref 6.5–8.1)

## 2024-09-03 LAB — CBC
HCT: 18.3 % — ABNORMAL LOW (ref 36.0–46.0)
Hemoglobin: 5.4 g/dL — ABNORMAL LOW (ref 12.0–15.0)
MCH: 27.3 pg (ref 26.0–34.0)
MCHC: 29.5 g/dL — ABNORMAL LOW (ref 30.0–36.0)
MCV: 92.4 fL (ref 80.0–100.0)
Platelets: 248 K/uL (ref 150–400)
RBC: 1.98 MIL/uL — ABNORMAL LOW (ref 3.87–5.11)
RDW: 12.8 % (ref 11.5–15.5)
WBC: 4.7 K/uL (ref 4.0–10.5)
nRBC: 0 % (ref 0.0–0.2)

## 2024-09-03 LAB — ABO/RH: ABO/RH(D): O POS

## 2024-09-03 LAB — PREPARE RBC (CROSSMATCH)

## 2024-09-03 LAB — HEMOGLOBIN AND HEMATOCRIT, BLOOD
HCT: 19.8 % — ABNORMAL LOW (ref 36.0–46.0)
Hemoglobin: 5.9 g/dL — ABNORMAL LOW (ref 12.0–15.0)

## 2024-09-03 LAB — APTT: aPTT: 58 s — ABNORMAL HIGH (ref 24–36)

## 2024-09-03 LAB — PROTIME-INR
INR: 1.7 — ABNORMAL HIGH (ref 0.8–1.2)
Prothrombin Time: 20.7 s — ABNORMAL HIGH (ref 11.4–15.2)

## 2024-09-03 MED ORDER — PANTOPRAZOLE SODIUM 40 MG IV SOLR
40.0000 mg | Freq: Two times a day (BID) | INTRAVENOUS | Status: DC
  Administered 2024-09-03 – 2024-09-13 (×18): 40 mg via INTRAVENOUS
  Filled 2024-09-03 (×19): qty 10

## 2024-09-03 MED ORDER — SODIUM CHLORIDE 0.9% IV SOLUTION: Freq: Once | INTRAVENOUS | Status: AC

## 2024-09-03 MED ORDER — SODIUM CHLORIDE 0.9% IV SOLUTION
Freq: Once | INTRAVENOUS | Status: DC
  Filled 2024-09-03: qty 250

## 2024-09-03 MED ORDER — MELATONIN 5 MG PO TABS
5.0000 mg | ORAL_TABLET | Freq: Every day | ORAL | Status: DC
  Administered 2024-09-03 – 2024-09-12 (×10): 5 mg via ORAL
  Filled 2024-09-03 (×10): qty 1

## 2024-09-03 MED ORDER — IOHEXOL 350 MG/ML SOLN
100.0000 mL | Freq: Once | INTRAVENOUS | Status: AC | PRN
  Administered 2024-09-03: 100 mL via INTRAVENOUS

## 2024-09-03 MED ORDER — TOPIRAMATE 25 MG PO TABS
50.0000 mg | ORAL_TABLET | Freq: Every day | ORAL | Status: DC
  Administered 2024-09-03 – 2024-09-13 (×10): 50 mg via ORAL
  Filled 2024-09-03 (×11): qty 2

## 2024-09-03 MED ORDER — ACETAMINOPHEN 325 MG PO TABS
650.0000 mg | ORAL_TABLET | Freq: Four times a day (QID) | ORAL | Status: DC | PRN
  Filled 2024-09-03: qty 2

## 2024-09-03 MED ORDER — ESCITALOPRAM OXALATE 10 MG PO TABS
5.0000 mg | ORAL_TABLET | Freq: Every day | ORAL | Status: DC
  Administered 2024-09-03 – 2024-09-13 (×10): 5 mg via ORAL
  Filled 2024-09-03 (×11): qty 1

## 2024-09-03 MED ORDER — ACETAMINOPHEN 650 MG RE SUPP: 650.0000 mg | Freq: Four times a day (QID) | RECTAL | Status: DC | PRN

## 2024-09-03 MED ORDER — ROPINIROLE HCL 0.25 MG PO TABS
0.2500 mg | ORAL_TABLET | Freq: Every day | ORAL | Status: DC
  Administered 2024-09-03 – 2024-09-12 (×10): 0.25 mg via ORAL
  Filled 2024-09-03 (×11): qty 1

## 2024-09-03 NOTE — H&P (Signed)
 History and Physical    Monique [name]:[id number] DOB: 02/18/1951 DOA: 09/03/2024  DOS: the patient was seen and examined on 09/03/2024  PCP: Monique Hadassah SQUIBB, MD   Patient coming from: Home  I have personally briefly reviewed patient's old medical records in [facility] and CareEverywhere  HPI:   Monique [name] is a 73 y.o. year old female with past medical history of iron deficiency anemia, DVT, restless leg syndrome, and MDD presenting to the ED with rectal bleeding.  Patient had a DVT that was diagnosed in June 2025 and was started on Eliquis .  She had difficulty affording Eliquis  and was getting samples.  Recently this was switched over to dabigatran.  This was on 10/01.  Patient followed with oncology and they recommended continuing low-dose Eliquis  indefinitely.  Patient has refused hypercoagulable workup.   Patient states she started taking the new blood thinner starting on 10/03 and noted bleeding with bowel movements and diarrhea.   ED Course: On arrival to the ED patient was noted to be hemodynamically stable.  CBC showed hemoglobin at 5.6, baseline around 10.3.  2 units ordered by EDP.  TRH consulted for admission.  TRH contacted for admission.  Review of Systems: As mentioned in the history of present illness. All other systems reviewed and are negative.    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 Authorizing Provider   dabigatran (PRADAXA) 150 MG CAPS capsule Take 1 capsule (150 mg total) by mouth 2 (two) times daily. Patient does not have insurance- please use GoodRx: BIN [id number], PCN [id number], ID [id number], Grp GDRX 08/24/24   Monique Hadassah SQUIBB, MD  Doxepin  HCl 6 MG TABS Take 0.5-1 tablets (3-6 mg total) by mouth at bedtime as needed (insomnia). 08/19/24   Monique Hadassah SQUIBB, MD  escitalopram  (LEXAPRO ) 5 MG tablet Take 1 tablet (5 mg total) by mouth daily. 07/02/24   Monique Hadassah SQUIBB, MD  Prenatal MV-Min-Fe Fum-FA-DHA (PRENATAL/FOLIC ACID+DHA PO) Take 1 tablet by mouth daily.    [provider]  topiramate (TOPAMAX) 50 MG tablet Take 1 tablet by mouth daily. 03/22/24   [provider]      reports that she quit smoking about 12 years ago. Her smoking use included cigarettes. She started smoking about 54 years ago. She has a 33.6 pack-year smoking history. She has never used smokeless tobacco. She reports that she does not drink alcohol and does not use drugs. Lives with by herself Currently retired and was in Air[employer].  Tobacco- 1/2 ppd for 50 years but quit 2 months ago.  EtOH- Denies use.  Illicit drug use- denies use.  IADLs/ADLs- can person independently at baseline    Physical Exam: Vitals:   09/03/24 1227 09/03/24 1231 09/03/24 1449 09/03/24 1505  BP: 123/61  124/61 127/61  Pulse: 88  84 87  Resp: 18  17 13   Temp: 97.8 F (36.6 C)  98.4 F (36.9 C) 98.4 F (36.9 C)  TempSrc:   Oral Oral  SpO2: 100%  100% 100%  Weight:  54.4 kg    Height:  5' 5 (1.651 m)       Gen: NAD, pleasant elderly female  HENT: Dry MM, pale conjunctiva  CV: RRR, diminished pedal pusles Resp: CTAB Abd: No TTP, normal bowel sounds MSK: no asymmetry Skin: no rashes or bruising noted on skin Neuro: alert and oriented x4 Psych: normal mood    Labs on Admission: I have personally reviewed following labs and imaging studies  CBC: Recent Labs  Lab 09/03/24 1228  WBC 4.7  HGB 5.4*  HCT 18.3*  MCV 92.4  PLT  248   Basic Metabolic Panel: Recent Labs  Lab 09/03/24 1228  NA 139  K 3.5  CL 103  CO2 22  GLUCOSE 127*  BUN 19  CREATININE 0.70  CALCIUM 8.4*   GFR: Estimated Creatinine Clearance: 52.2 mL/min (by C-G formula based on SCr of 0.7 mg/dL). Liver Function Tests: No results for input(s): AST, ALT, ALKPHOS, BILITOT, PROT, ALBUMIN in the last 168 hours. No results for input(s): LIPASE, AMYLASE in the last 168 hours. No results for input(s): AMMONIA in the last 168 hours. Coagulation Profile: Recent Labs  Lab 09/03/24 1346  INR 1.7*   Cardiac Enzymes: No results for input(s): CKTOTAL, CKMB, CKMBINDEX, TROPONINI, TROPONINIHS in the last 168 hours. BNP (last 3 results) No results for input(s): BNP in the last 8760 hours. HbA1C: No results for input(s): HGBA1C in the last 72 hours. CBG: No results for input(s): GLUCAP in the last 168 hours. Lipid Profile: No results for input(s): CHOL, HDL, LDLCALC, TRIG, CHOLHDL, LDLDIRECT in the last 72 hours. Thyroid Function Tests: No results for input(s): TSH, T4TOTAL, FREET4, T3FREE, THYROIDAB in the last 72 hours. Anemia Panel: No results for input(s): VITAMINB12, FOLATE, FERRITIN, TIBC, IRON, RETICCTPCT in the last 72 hours. Urine analysis: No results found for: COLORURINE, APPEARANCEUR, LABSPEC, PHURINE, GLUCOSEU, HGBUR, BILIRUBINUR, KETONESUR, PROTEINUR, UROBILINOGEN, NITRITE, LEUKOCYTESUR  Radiological Exams on Admission: I have personally reviewed images US  Venous Img Lower Unilateral Right (DVT) Result Date: 09/02/2024 CLINICAL DATA:  History of known DVT in the right popliteal and calf veins EXAM: RIGHT LOWER EXTREMITY VENOUS DOPPLER ULTRASOUND TECHNIQUE: Gray-scale sonography with graded compression, as well as color Doppler and duplex ultrasound were performed to evaluate the lower extremity deep venous systems from the level of the common  femoral vein and including the common femoral, femoral, profunda femoral, popliteal and calf veins including the posterior tibial, peroneal and gastrocnemius veins when visible. The superficial great saphenous vein was also interrogated. Spectral Doppler was utilized to evaluate flow at rest and with distal augmentation maneuvers in the common femoral, femoral and popliteal veins. COMPARISON:  Prior duplex venous ultrasound 05/12/2024 FINDINGS: Contralateral Common Femoral Vein: Respiratory phasicity is normal and symmetric with the symptomatic side. No evidence of thrombus. Normal compressibility. Common Femoral Vein: No evidence of thrombus. Normal compressibility, respiratory phasicity and response to augmentation. Saphenofemoral Junction: No evidence of thrombus. Normal compressibility and flow on color Doppler imaging. Profunda Femoral Vein: No evidence of thrombus. Normal compressibility and flow on color Doppler imaging. Femoral Vein: No evidence of thrombus. Normal compressibility, respiratory phasicity and response to augmentation. Popliteal Vein: No evidence of thrombus. Normal compressibility, respiratory phasicity and response to augmentation. Calf Veins: Segmental nonocclusive thrombus remains visible within  the posterior tibial veins. Superficial Great Saphenous Vein: No evidence of thrombus. Normal compressibility. Venous Reflux:  None. Other Findings:  None. IMPRESSION: 1. No evidence of acute deep venous thrombosis. 2. Significant interval improvement in previously identified DVT with complete resolution of thrombus from within the popliteal veins. There is a small amount of residual chronic thrombus in the posterior tibial veins within the calf. Electronically Signed   By: Wilkie Lent M.D.   On: 09/02/2024 11:41    EKG: My personal interpretation of EKG shows: Sinus rhythm with no acute ST changes.     Assessment/Plan Principal Problem:   Acute GI bleeding   ABLA 2/2 Lower GI  Bleed Hx IDA Pt with declining hgb but HDS suspect lower GIB given hematochezia and normal BUN and gradual decline over 5 days. Pt without hx of GIB. Denies NSAID use. States never had colonoscopy or endoscopy.  -GI consult, appreciate their assistance -Maintain adequate acess with 2 large bore PIV -IV PPI 40 mg BID -Keep NPO -H/H q6hrs, transfuse if  hgb <7. One unit ordered in the ED, 1 ordered by me later.  -Will get iron studies after transfusion to see if pt has continuous iron deficit. She has hx of IDA  Chronic Problems: MDD: restart home SSRI Insomnia: Currently not taking any pharmacotherapy. Will benefit from CBT outpatient. Melatonin ordered.  DVT: Holding AC. Per oncology, she will need indefinite AC at low doses. Will hold now an await GI findings.  RLS: Pt on ropinirole 0.25 mg tablet nightly. Will restart this. Treating IDA will help with this as well. Repeat iron studies post transfusion. Ferritin goal >75. Ferritin <10 when checked on 09/01/24.  VTE prophylaxis:  SCDs  Diet: Access: Lines: Code Status:  Full Code Telemetry:  Admission status: Inpatient, Telemetry bed Patient is from: Home  Anticipated d/c is to: Home  Anticipated d/c is in: 3-4 days    Family Communication: Updated at bedside   Consults called: GI    Severity of Illness: The appropriate patient status for this patient is INPATIENT. Inpatient status is judged to be reasonable and necessary in order to provide the required intensity of service to ensure the patient's safety. The patient's presenting symptoms, physical exam findings, and initial radiographic and laboratory data in the context of their chronic comorbidities is felt to place them at high risk for further clinical deterioration. Furthermore, it is not anticipated that the patient will be medically stable for discharge from the hospital within 2 midnights of admission.   * I certify that at the point of admission it is my clinical  judgment that the patient will require inpatient hospital care spanning beyond 2 midnights from the point of admission due to high intensity of service, high risk for further deterioration and high frequency of surveillance required.DEWAINE Morene Bathe, MD [place] DEL. [facility]

## 2024-09-03 NOTE — Consult Note (Signed)
 [facility] GI Inpatient Consult Note   Ladell Francis Boss, M.D.  Reason for Consult:  Lower intestinal bleeding   Attending Requesting Consult: Morene Bathe, M.D.  Outpatient Primary Physician:   History of Present Illness: Monique [name] is a 73 y.o. female presenting to the emergency room with complaints of intermittent rectal bleeding with diarrhea over the last 5 to 7 days since switching anticoagulation from Eliquis  to Pradaxa 150 mg twice daily.  The patient says the reason for the switch of anticoagulants is that she could not afford the Eliquis  and was able to get the Pradaxa at a discounted rate.  She has mild right lower quadrant discomfort but overall no abdominal pain.  The patient does complain of significant fatigue and presyncope. Patient had a blood sample showing low ferritin of 10 on 09/01/2024. Today in the emergency room the patient showed marked anemia with hemoglobin of 5.4 markedly down from her baseline hemoglobin of around 10.6. The patient says the bleeding began in June after beginning Eliquis  for deep vein thrombosis in the right lower extremity.  Patient says that her stools became darker but not as frequent on the Eliquis .  Bleeding appeared to accelerate after beginning the Pradaxa 10 days ago.  The patient reports she has undergone no previous colorectal cancer screening in the form of colonoscopy or flexible sigmoidoscopy.  She has not undergone previous upper endoscopies.  She denies any previous episodes of gastrointestinal bleeding.      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   Scheduled Inpatient Medications:    pantoprazole (PROTONIX) IV  40 mg Intravenous Q12H    Continuous Inpatient Infusions:    PRN Inpatient Medications:  acetaminophen **OR** acetaminophen  Family History: family history includes Emphysema in her maternal grandfather; Lung cancer in her maternal uncle; Stomach cancer in her maternal aunt.   GI Family History: Negative  Social History:   reports that she quit smoking about 12 years ago. Her smoking use included cigarettes. She started smoking about 54 years ago. She has a 33.6 pack-year smoking history. She has never used smokeless tobacco. She reports that she does not drink alcohol and does not use drugs. The patient denies ETOH, tobacco, or drug use.    Review of Systems: Review of Systems - Negative except history of present illness  Physical Examination: BP 127/61   Pulse 87   Temp 98.4 F (36.9 C) (Oral)   Resp 13   Ht 5' 5 (1.651 m)   Wt 54.4 kg   LMP  (LMP Unknown)   SpO2 100%   BMI 19.97 kg/m  Physical Exam Constitutional:      Appearance: She is normal weight. She is not ill-appearing or diaphoretic.  HENT:     Head: Normocephalic and atraumatic.     Right Ear: External ear normal.     Left Ear: External ear normal.     Nose: Nose normal.     Mouth/Throat:     Mouth: Mucous membranes are moist.     Pharynx: No oropharyngeal exudate or posterior oropharyngeal erythema.  Eyes:     Extraocular Movements: Extraocular movements  intact.     Conjunctiva/sclera: Conjunctivae normal.     Pupils: Pupils are equal, round, and reactive to light.  Cardiovascular:     Rate and Rhythm: Normal rate.     Pulses: Normal pulses.     Heart sounds: Normal heart sounds.  Pulmonary:     Breath sounds: Normal breath sounds.  Abdominal:     Palpations: Abdomen is soft.  Musculoskeletal:        General: No swelling or tenderness.     Cervical back: Normal range of motion.  Skin:    General: Skin is warm and dry.     Capillary Refill: Capillary refill takes less than 2 seconds.  Neurological:     Mental Status: She is alert.  Psychiatric:        Mood and Affect: Mood is depressed.        Speech: Speech normal.        Behavior: Behavior normal.        Thought Content: Thought content normal.        Cognition and Memory: Cognition and memory normal.        Judgment: Judgment normal.     Data: Lab Results  Component Value Date   WBC 4.7 09/03/2024   HGB 5.4 (L) 09/03/2024   HCT 18.3 (L) 09/03/2024   MCV 92.4 09/03/2024   PLT 248 09/03/2024   Recent Labs  Lab 09/03/24 1228  HGB 5.4*   Lab Results  Component Value Date   NA 139 09/03/2024   K 3.5 09/03/2024   CL 103 09/03/2024   CO2 22 09/03/2024   BUN 19 09/03/2024   CREATININE 0.70 09/03/2024   Lab Results  Component Value Date   ALT 16 06/14/2024   AST 17 06/14/2024   ALKPHOS 73 06/14/2024   BILITOT 0.4 06/14/2024   Recent Labs  Lab 09/03/24 1346  APTT 58*  INR 1.7*      Latest Ref Rng & Units 09/03/2024   12:28 PM 06/14/2024    1:43 PM 12/09/2011    8:34 AM  CBC  WBC 4.0 - 10.5 K/uL 4.7  4.5    Hemoglobin 12.0 - 15.0 g/dL 5.4  89.6  89.5   Hematocrit 36.0 - 46.0 % 18.3  31.9  30.7   Platelets 150 - 400 K/uL 248  247      STUDIES: CT ANGIO GI BLEED Result Date: 09/03/2024 CLINICAL DATA:  Acute lower GI bleeding. EXAM: CTA ABDOMEN AND PELVIS WITHOUT AND WITH CONTRAST TECHNIQUE: Multidetector CT imaging of the abdomen and pelvis was  performed using the standard protocol during bolus administration of intravenous contrast. Multiplanar reconstructed images and MIPs were obtained and reviewed to evaluate the vascular anatomy. RADIATION DOSE REDUCTION: This exam was performed according to the departmental dose-optimization program which includes automated exposure control, adjustment of the mA and/or kV according to patient size and/or use of iterative reconstruction technique. CONTRAST:  100mL OMNIPAQUE IOHEXOL 350 MG/ML SOLN COMPARISON:  CT chest 12/04/2011.  Chest radiograph 12/06/2011 FINDINGS: VASCULAR Aorta: Normal caliber aorta without aneurysm, dissection, vasculitis or significant stenosis. Diffuse aortic calcification. Celiac: Patent without evidence of aneurysm, dissection, vasculitis or significant stenosis. SMA: Patent without evidence of aneurysm, dissection, vasculitis or significant stenosis. Renals: Both renal arteries are patent without evidence of aneurysm, dissection, vasculitis, fibromuscular dysplasia or significant stenosis. IMA: Patent without evidence of aneurysm, dissection, vasculitis or significant stenosis. Inflow: Patent without evidence of aneurysm, dissection, vasculitis or significant stenosis. Proximal Outflow: Bilateral common femoral and visualized portions of the superficial and profunda femoral arteries are patent without evidence of aneurysm, dissection, vasculitis or significant stenosis. Veins: No obvious venous abnormality within the limitations of this arterial phase study. Review of the MIP images confirms the above findings. NON-VASCULAR Lower chest: Mild dependent changes in the lung bases. Hepatobiliary: No focal liver abnormality is seen. No gallstones, gallbladder wall thickening, or biliary dilatation. Pancreas: Unremarkable. No pancreatic ductal dilatation or surrounding inflammatory changes. Spleen: Normal in size without focal abnormality. Adrenals/Urinary Tract: Adrenal glands are unremarkable.  Kidneys are normal, without renal calculi, focal lesion, or hydronephrosis. Bladder is unremarkable. Stomach/Bowel: Stomach, small bowel, and colon are not abnormally distended. No wall thickening or inflammatory stranding identified. Colonic diverticulosis without evidence of acute diverticulitis. Appendix is normal. No areas of abnormal intraluminal contrast extravasation or contrast pooling or identified. No focal site of active gastrointestinal bleeding is identified. Increased density demonstrated within multiple colonic diverticula consistent with inspissated stool. Lymphatic: No significant lymphadenopathy. Reproductive: Uterus and bilateral adnexa are unremarkable. Other: No abdominal wall hernia or abnormality. No abdominopelvic ascites. Musculoskeletal: No acute or significant osseous findings. IMPRESSION: VASCULAR Diffuse aortic atherosclerosis. No aneurysm, dissection, or critical stenosis demonstrated in the major abdominal/pelvic arterial system. NON-VASCULAR 1. No evidence of bowel obstruction or inflammation. Colonic diverticulosis without evidence of acute diverticulitis. 2. No focal area of active gastrointestinal hemorrhage is identified. Electronically Signed   By: Elsie Gravely M.D.   On: 09/03/2024 16:02   US  Venous Img Lower Unilateral Right (DVT) Result Date: 09/02/2024 CLINICAL DATA:  History of known DVT in the right popliteal and calf veins EXAM: RIGHT LOWER EXTREMITY VENOUS DOPPLER ULTRASOUND TECHNIQUE: Gray-scale sonography with graded compression, as well as color Doppler and duplex ultrasound were performed to evaluate the lower extremity deep venous systems from the level of the common femoral vein and including the common femoral, femoral, profunda femoral, popliteal and calf veins including the posterior tibial, peroneal and gastrocnemius veins when visible. The superficial great saphenous vein was also interrogated. Spectral Doppler was utilized to evaluate flow at rest and  with distal augmentation maneuvers in the common femoral, femoral and popliteal veins. COMPARISON:  Prior duplex venous ultrasound 05/12/2024 FINDINGS: Contralateral Common Femoral Vein: Respiratory phasicity is normal and symmetric with the symptomatic side. No evidence of thrombus. Normal compressibility. Common Femoral Vein: No evidence of thrombus. Normal compressibility, respiratory phasicity and response to augmentation. Saphenofemoral Junction: No evidence of thrombus. Normal compressibility and flow on color Doppler imaging. Profunda Femoral Vein: No evidence of thrombus. Normal compressibility and flow on color Doppler imaging. Femoral Vein: No evidence of thrombus. Normal compressibility, respiratory phasicity and response to augmentation. Popliteal Vein: No evidence of thrombus. Normal compressibility, respiratory phasicity and response to augmentation. Calf Veins: Segmental nonocclusive thrombus remains visible within the posterior tibial veins. Superficial Great Saphenous Vein: No evidence of thrombus. Normal compressibility. Venous Reflux:  None. Other Findings:  None. IMPRESSION: 1. No evidence of acute deep venous thrombosis. 2. Significant interval improvement  in previously identified DVT with complete resolution of thrombus from within the popliteal veins. There is a small amount of residual chronic thrombus in the posterior tibial veins within the calf. Electronically Signed   By: Wilkie Lent M.D.   On: 09/02/2024 11:41   @IMAGES @  Assessment: Principal Problem:   Acute GI bleeding Acute on chronic lower GI bleeding.- Initially occurred with Eliquis  use to a lesser extent (melena) , but worsened after switching to Pradaxa (bright red blood per rectum). Presumed supratherapeutic anticoagulation. Increased aPTT though this is not definitive for supratherapeutic level of pradaxa. Anemia secondary to the above.   Recommendations:  Transfuse to Hgb of greater than or equal to  8. Serial H/H. Serial exams. EGD and colonoscopy when clinically feasible, ideally after normalization of aPTT and increased Hgb. The patient understands the nature of the planned procedure, indications, risks, alternatives and potential complications including but not limited to bleeding, infection, perforation, damage to internal organs and possible oversedation/side effects from anesthesia. The patient agrees and gives consent to proceed.  Please refer to procedure notes for findings, recommendations and patient disposition/instructions. Will follow along. Begin full liquids.  Thank you for the consult. Please call with questions or concerns.  Aundria Ladell Eck MD [facility] [address] [place], [place] [address] [phone]  09/03/2024 4:30 PM

## 2024-09-03 NOTE — ED Provider Notes (Signed)
 [facility] Provider Note    Event Date/Time   First MD Initiated Contact with Patient 09/03/24 1325     (approximate)   History   Rectal Bleeding   HPI  Monique [name] is a 73 y.o. 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  She reports intermittent bloody stools over the last week or so.  No abdominal pain reported.  Today she felt very weak and lightheaded     Physical Exam   Triage Vital Signs: ED Triage Vitals  Encounter Vitals Group     BP 09/03/24 1227 123/61     Girls Systolic BP Percentile --      Girls Diastolic BP Percentile --      Boys Systolic BP Percentile --      Boys Diastolic BP Percentile --      Pulse Rate 09/03/24 1227 88     Resp 09/03/24 1227 18     Temp 09/03/24 1227 97.8 F (36.6 C)     Temp src --      SpO2 09/03/24 1227 100 %     Weight 09/03/24 1231 54.4 kg (120 lb)     Height 09/03/24 1231 1.651 m (5' 5)     Head Circumference --      Peak Flow --      Pain Score 09/03/24 1231 0     Pain Loc --      Pain Education --      Exclude from Growth Chart --     Most recent vital signs: Vitals:   09/03/24 1227  BP: 123/61  Pulse: 88  Resp: 18  Temp: 97.8 F (36.6 C)  SpO2: 100%     General: Awake, no distress.  CV:  Good peripheral perfusion.  Resp:  Normal effort.  Abd:  No distention.  Soft, nontender Other:     ED Results / Procedures / Treatments   Labs (all labs ordered are listed, but only abnormal results are displayed) Labs Reviewed  CBC - Abnormal; Notable for the following components:      Result Value   RBC 1.98 (*)    Hemoglobin 5.4 (*)    HCT 18.3 (*)    MCHC 29.5 (*)    All other components within normal limits  BASIC METABOLIC PANEL WITH GFR - Abnormal; Notable for  the following components:   Glucose, Bld 127 (*)    Calcium 8.4 (*)    All other components within normal limits  APTT  PROTIME-INR  TYPE AND SCREEN  PREPARE RBC (CROSSMATCH)     EKG     RADIOLOGY     PROCEDURES:  Critical Care performed: yes  CRITICAL CARE Performed by: Lamar Price   Total critical care time: 30 minutes  Critical care time was exclusive of separately billable procedures and treating other patients.  Critical care was necessary to treat or prevent imminent or life-threatening deterioration.  Critical care was time spent personally by me on the following activities: development of treatment plan with patient and/or surrogate as well as nursing, discussions with consultants, evaluation of patient's response to treatment, examination of patient, obtaining history from patient or surrogate, ordering and performing treatments and interventions, ordering and review of laboratory studies, ordering  and review of radiographic studies, pulse oximetry and re-evaluation of patient's condition.   Procedures   MEDICATIONS ORDERED IN ED: Medications  0.9 %  sodium chloride infusion (Manually program via Guardrails IV Fluids) ( Intravenous New Bag/Given 09/03/24 1343)     IMPRESSION / MDM / ASSESSMENT AND PLAN / ED COURSE  I reviewed the triage vital signs and the nursing notes. Patient's presentation is most consistent with acute presentation with potential threat to life or bodily function.  Patient presents with dizziness, lightheadedness, generalized weakness as above, vital signs overall reassuring.  However lab work notable for hemoglobin of 5.4 down from a hemoglobin of around 10, 2 months ago.  She reports rectal bleeding intermittently over the last week, blood mixed with brown stool  This is certainly the cause of her weakness.  Have consented the patient for PRBCs, have ordered 2 units of PRBCs  Will consult the hospitalist team for  admission        FINAL CLINICAL IMPRESSION(S) / ED DIAGNOSES   Final diagnoses:  Acute GI bleeding     Rx / DC Orders   ED Discharge Orders     None        Note:  This document was prepared using Dragon voice recognition software and may include unintentional dictation errors.   Arlander Charleston, MD 09/03/24 1346

## 2024-09-03 NOTE — Telephone Encounter (Signed)
 pt called crying stating that this morning she had been having diarrhea with blood in it and she did not think she could drive to her appt today. Pt was very scared and I told her that since she had multiple episodes of diarrhea with blood, she should call EMS and go to ED for further eval. Pt verbalized understanding. MD notified and agrees.   Please cancel today's appt.

## 2024-09-03 NOTE — ED Triage Notes (Signed)
 Arrives from home via [facility].  Noticed blood in stool x 1 week.  Takes blood thinners.  136/49 70 100%

## 2024-09-03 NOTE — ED Triage Notes (Signed)
 Pt reports rectal bleeding for 1 week since starting new blood thinner.

## 2024-09-03 NOTE — ED Notes (Signed)
 Patient resting quietly.  Blood transfusion infusing without difficulty.  Call bell within reach.

## 2024-09-04 DIAGNOSIS — K922 Gastrointestinal hemorrhage, unspecified: Secondary | ICD-10-CM | POA: Diagnosis present

## 2024-09-04 LAB — HEMOGLOBIN AND HEMATOCRIT, BLOOD
HCT: 22.3 % — ABNORMAL LOW (ref 36.0–46.0)
HCT: 23.4 % — ABNORMAL LOW (ref 36.0–46.0)
HCT: 25.9 % — ABNORMAL LOW (ref 36.0–46.0)
Hemoglobin: 6.9 g/dL — ABNORMAL LOW (ref 12.0–15.0)
Hemoglobin: 7.3 g/dL — ABNORMAL LOW (ref 12.0–15.0)
Hemoglobin: 8.5 g/dL — ABNORMAL LOW (ref 12.0–15.0)

## 2024-09-04 LAB — CBC WITH DIFFERENTIAL/PLATELET
Abs Immature Granulocytes: 0.01 K/uL (ref 0.00–0.07)
Basophils Absolute: 0 K/uL (ref 0.0–0.1)
Basophils Relative: 1 %
Eosinophils Absolute: 0.1 K/uL (ref 0.0–0.5)
Eosinophils Relative: 3 %
HCT: 23 % — ABNORMAL LOW (ref 36.0–46.0)
Hemoglobin: 7 g/dL — ABNORMAL LOW (ref 12.0–15.0)
Immature Granulocytes: 0 %
Lymphocytes Relative: 24 %
Lymphs Abs: 0.8 K/uL (ref 0.7–4.0)
MCH: 27.3 pg (ref 26.0–34.0)
MCHC: 30.4 g/dL (ref 30.0–36.0)
MCV: 89.8 fL (ref 80.0–100.0)
Monocytes Absolute: 0.3 K/uL (ref 0.1–1.0)
Monocytes Relative: 10 %
Neutro Abs: 2.2 K/uL (ref 1.7–7.7)
Neutrophils Relative %: 62 %
Platelets: 215 K/uL (ref 150–400)
RBC: 2.56 MIL/uL — ABNORMAL LOW (ref 3.87–5.11)
RDW: 14.2 % (ref 11.5–15.5)
WBC: 3.5 K/uL — ABNORMAL LOW (ref 4.0–10.5)
nRBC: 0 % (ref 0.0–0.2)

## 2024-09-04 MED ORDER — SODIUM CHLORIDE 0.9% IV SOLUTION
Freq: Once | INTRAVENOUS | Status: AC
  Filled 2024-09-04: qty 250

## 2024-09-04 MED ORDER — TRAMADOL HCL 50 MG PO TABS: 50.0000 mg | ORAL_TABLET | Freq: Four times a day (QID) | ORAL | Status: DC | PRN

## 2024-09-04 NOTE — Evaluation (Signed)
 Occupational Therapy Evaluation Patient Details Name: Monique [name] MRN: [id number] DOB: 11/14/1951 Today's Date: 09/04/2024   History of Present Illness   Monique [name] is a 73 y.o. female presenting to the emergency room with complaints of intermittent rectal bleeding with diarrhea over the last 5 to 7 days since switching anticoagulation from Eliquis  to Pradaxa 150 mg twice daily.  The patient says the reason for the switch of anticoagulants is that she could not afford the Eliquis  and was able to get the Pradaxa at a discounted rate.  She has mild right lower quadrant discomfort but overall no abdominal pain.  The patient does complain of significant fatigue and presyncope.  Patient had a blood sample showing low ferritin of 10 on 09/01/2024.  Today in the emergency room the patient showed marked anemia with hemoglobin of 5.4 markedly down from her baseline hemoglobin of around 10.6.  The patient says the bleeding began in June after beginning Eliquis  for deep vein thrombosis in the right lower extremity.  Patient says that her stools became darker but not as frequent on the Eliquis .  Bleeding appeared to accelerate after beginning the Pradaxa 10 days ago.     Clinical Impressions Patient was seen for OT evaluation this date. Prior to hospital admission, patient was independent and ambulating without AD. Patient lives alone with minimal family support. Patient has been hospitalized due to GI bleed with resulting low hemoglobin, she has been transfused and is awaiting another transfusion.  During OT eval, patient performing bed mobility/transfers/in room ambulation without AD or difficulty; supervision provided but no cues needed. Patient performed LB dressing, toileting and grooming (while standing) with no reports of fatigue/difficulty and no assist required. Patient is at baseline for ADLs and does not require further OT intervention, will sign off.      If plan is discharge home,  recommend the following:         Functional Status Assessment   Patient has had a recent decline in their functional status and demonstrates the ability to make significant improvements in function in a reasonable and predictable amount of time.     Equipment Recommendations   None recommended by OT     Recommendations for Other Services         Precautions/Restrictions   Precautions Precautions: None Restrictions Weight Bearing Restrictions Per Provider Order: No     Mobility Bed Mobility Overal bed mobility: Independent                  Transfers Overall transfer level: Independent Equipment used: None                      Balance Overall balance assessment: Independent                                         ADL either performed or assessed with clinical judgement   ADL Overall ADL's : At baseline;Independent                                       General ADL Comments: patient performed toileting, LB dressing and grooming tasks while standing at sink without any assist or cues for safety. patient independent at baseline and currently requires no A or AE     Vision  Perception         Praxis         Pertinent Vitals/Pain Pain Assessment Pain Assessment: No/denies pain     Extremity/Trunk Assessment Upper Extremity Assessment Upper Extremity Assessment: Overall WFL for tasks assessed   Lower Extremity Assessment Lower Extremity Assessment: Overall WFL for tasks assessed       Communication Communication Communication: No apparent difficulties   Cognition Arousal: Alert Behavior During Therapy: WFL for tasks assessed/performed Cognition: No apparent impairments                               Following commands: Intact       Cueing  General Comments   Cueing Techniques: Verbal cues      Exercises     Shoulder Instructions      Home Living  Family/patient expects to be discharged to:: Private residence Living Arrangements: Alone Available Help at Discharge: Other (Comment) (no real help available per patient) Type of Home: House Home Access: Stairs to enter [employer] of Steps: 1   Home Layout: One level     Bathroom Shower/Tub: Theme park manager: Yes   Home Equipment: Production assistant, radio - single point          Prior Functioning/Environment Prior Level of Function : Independent/Modified Independent             Mobility Comments: independent, no device ADLs Comments: independent    OT Problem List: Decreased activity tolerance   OT Treatment/Interventions:        OT Goals(Current goals can be found in the care plan section)   Acute Rehab OT Goals OT Goal Formulation: All assessment and education complete, DC therapy   OT Frequency:       Co-evaluation              AM-PAC OT 6 Clicks Daily Activity     Outcome Measure Help from another person eating meals?: None Help from another person taking care of personal grooming?: None Help from another person toileting, which includes using toliet, bedpan, or urinal?: None Help from another person bathing (including washing, rinsing, drying)?: None Help from another person to put on and taking off regular upper body clothing?: None Help from another person to put on and taking off regular lower body clothing?: None 6 Click Score: 24   End of Session Nurse Communication: Other (comment) (requesting assist with telebox)  Activity Tolerance: Patient tolerated treatment well Patient left: in bed;with call bell/phone within reach;with bed alarm set  OT Visit Diagnosis: Other abnormalities of gait and mobility (R26.89)                Time: 9756-9694 OT Time Calculation (min): 22 min Charges:  OT General Charges $OT Visit: 1 Visit OT Evaluation $OT Eval Low Complexity: 1 Low OT  Treatments $Self Care/Home Management : 8-22 mins  Rogers Clause, OT/L MSOT, 09/04/2024

## 2024-09-04 NOTE — Progress Notes (Signed)
 Progress Note   Patient: Monique [name] FMW:[id number] DOB: [birth year] DOA: 09/03/2024     1 DOS: the patient was seen and examined on 09/04/2024   Brief hospital course:  From HPI Rachal Dvorsky is a 73 y.o. year old female with past medical history of iron deficiency anemia, DVT, restless leg syndrome, and MDD presenting to the ED with rectal bleeding.  Patient had a DVT that was diagnosed in June 2025 and was started on Eliquis .  She had difficulty affording Eliquis  and was getting samples.  Recently this was switched over to dabigatran.  This was on 10/01.  Patient followed with oncology and they recommended continuing low-dose Eliquis  indefinitely.  Patient has refused hypercoagulable workup.     Patient states she started taking the new blood thinner starting on 10/03 and noted bleeding with bowel movements and diarrhea.    ED Course: On arrival to the ED patient was noted to be hemodynamically stable.  CBC showed hemoglobin at 5.6, baseline around 10.3.  2 units ordered by EDP.  TRH consulted for admission.    Assessment and Plan:    ABLA 2/2 Lower GI Bleed Hx IDA Pt with declining hgb but HDS suspect lower GIB given hematochezia and normal BUN and gradual decline over 5 days. Pt without hx of GIB. Denies NSAID use. States never had colonoscopy or endoscopy.  -GI consult, appreciate their assistance -Maintain adequate acess with 2 large bore PIV -IV PPI 40 mg BID - Gastroenterologist on board and planning colonoscopy hopefully on Monday Continue to monitor H&H We will give 2 more units of blood transfusion today    Chronic Problems: MDD: restart home SSRI Insomnia: Currently not taking any pharmacotherapy. Will benefit from CBT outpatient. Melatonin ordered.  DVT: Holding AC. Per oncology, she will need indefinite AC at low doses. Will hold now an await GI findings.  RLS: Pt on ropinirole 0.25 mg tablet nightly. Will restart this. Treating IDA will help with this as  well. Repeat iron studies post transfusion. Ferritin goal >75. Ferritin <10 when checked on 09/01/24.   VTE prophylaxis:  SCDs   Code Status:  Full Code     Family Communication: Updated at bedside    Consults called: GI       Subjective:  She did have some bleeding this morning Hemoglobin still on the lower side despite transfusion We will transfuse to keep Hb greater than 8 Denies abdominal pain chest pain or cough  Physical Exam:  Gen: NAD, pleasant elderly female  HENT: Dry MM, pale conjunctiva  CV: RRR, diminished pedal pusles Resp: CTAB Abd: No TTP, normal bowel sounds MSK: no asymmetry Skin: no rashes or bruising noted on skin Neuro: alert and oriented x4 Psych: normal mood     Vitals:   09/04/24 0630 09/04/24 0700 09/04/24 0849 09/04/24 1338  BP: (!) 108/58 (!) 103/52 106/63 (!) 132/57  Pulse: 71 72 72 90  Resp: 10 14 16 16   Temp:   98 F (36.7 C) 98.7 F (37.1 C)  TempSrc:   Oral Oral  SpO2: 99% 97% 99% 100%  Weight:      Height:        Data Reviewed:    Latest Ref Rng & Units 09/04/2024    8:22 AM 09/04/2024    2:18 AM 09/03/2024    5:55 PM  CBC  WBC 4.0 - 10.5 K/uL 3.5     Hemoglobin 12.0 - 15.0 g/dL 7.0  6.9  5.9   Hematocrit 36.0 - 46.0 %  23.0  22.3  19.8   Platelets 150 - 400 K/uL 215          Latest Ref Rng & Units 09/03/2024   12:28 PM 06/14/2024    1:43 PM 12/09/2011    8:34 AM  BMP  Glucose 70 - 99 mg/dL 872  91    BUN 8 - 23 mg/dL 19  11    Creatinine 9.55 - 1.00 mg/dL 9.29  9.32    BUN/Creat Ratio 12 - 28  16    Sodium 135 - 145 mmol/L 139  140    Potassium 3.5 - 5.1 mmol/L 3.5  4.4  2.8   Chloride 98 - 111 mmol/L 103  106    CO2 22 - 32 mmol/L 22  22    Calcium 8.9 - 10.3 mg/dL 8.4  9.3        Family Communication: No family at bedside  Disposition: Pending clinical course Time spent: 53 minutes  Author: Drue ONEIDA Potter, MD 09/04/2024 2:47 PM  For on call review [email or web address].

## 2024-09-04 NOTE — Progress Notes (Signed)
 Refused tele. Notified MD

## 2024-09-04 NOTE — Progress Notes (Signed)
 [facility] Gastroenterology Inpatient Progress Note    Subjective: Patient seen for follow up of lower GI bleed. Patient reports one bm with less blood today. No abdominal pain. Patient recently asked to be discharged d/t anxiety from being in the hospital.  Objective: Vital signs in last 24 hours: Temp:  [97.8 F (36.6 C)-98.7 F (37.1 C)] 98.7 F (37.1 C) (10/11 1338) Pulse Rate:  [71-93] 90 (10/11 1338) Resp:  [10-24] 16 (10/11 1338) BP: (91-141)/(37-87) 132/57 (10/11 1338) SpO2:  [90 %-100 %] 100 % (10/11 1338) Blood pressure (!) 132/57, pulse 90, temperature 98.7 F (37.1 C), temperature source Oral, resp. rate 16, height 5' 5 (1.651 m), weight 54.4 kg, SpO2 100%.    Intake/Output from previous day: No intake/output data recorded.  Intake/Output this shift: No intake/output data recorded.   Gen: NAD. Appears comfortable.  HEENT: [place]/AT. PERRLA. Normal external ear exam.  Chest: CTA, no wheezes.  CV: RR nl S1, S2. No gallops.  Abd: soft, nt, nd. BS+  Ext: no edema. Pulses 2+  Neuro: Alert and oriented. Judgement appears normal. Nonfocal.   Lab Results: Results for orders placed or performed during the hospital encounter of 09/03/24 (from the past 24 hours)  APTT     Status: Abnormal   Collection Time: 09/03/24  1:46 PM  Result Value Ref Range   aPTT 58 (H) 24 - 36 seconds  Protime-INR     Status: Abnormal   Collection Time: 09/03/24  1:46 PM  Result Value Ref Range   Prothrombin Time 20.7 (H) 11.4 - 15.2 seconds   INR 1.7 (H) 0.8 - 1.2  Prepare RBC (crossmatch)     Status: None   Collection Time: 09/03/24  1:46 PM  Result Value Ref Range   Order Confirmation      ORDER PROCESSED BY BLOOD BANK Performed at [facility], [address] [place], [place] [address]   Hemoglobin and hematocrit, blood     Status: Abnormal   Collection Time: 09/03/24  5:55 PM  Result Value Ref Range   Hemoglobin 5.9 (L) 12.0 - 15.0 g/dL   HCT 80.1 (L) 63.9 -  46.0 %  Hepatic function panel     Status: Abnormal   Collection Time: 09/03/24  5:55 PM  Result Value Ref Range   Total Protein 5.4 (L) 6.5 - 8.1 g/dL   Albumin 2.8 (L) 3.5 - 5.0 g/dL   AST 21 15 - 41 U/L   ALT 16 0 - 44 U/L   Alkaline Phosphatase 46 38 - 126 U/L   Total Bilirubin 0.4 0.0 - 1.2 mg/dL   Bilirubin, Direct <9.8 0.0 - 0.2 mg/dL   Indirect Bilirubin NOT CALCULATED 0.3 - 0.9 mg/dL  Prepare RBC (crossmatch)     Status: None   Collection Time: 09/03/24  8:02 PM  Result Value Ref Range   Order Confirmation      ORDER PROCESSED BY BLOOD BANK Performed at [facility], [address] [place], [place] [address]   Hemoglobin and hematocrit, blood     Status: Abnormal   Collection Time: 09/04/24  2:18 AM  Result Value Ref Range   Hemoglobin 6.9 (L) 12.0 - 15.0 g/dL   HCT 77.6 (L) 63.9 - 53.9 %  CBC with Differential/Platelet     Status: Abnormal   Collection Time: 09/04/24  8:22 AM  Result Value Ref Range   WBC 3.5 (L) 4.0 - 10.5 K/uL   RBC 2.56 (L) 3.87 - 5.11 MIL/uL  Hemoglobin 7.0 (L) 12.0 - 15.0 g/dL   HCT 76.9 (L) 63.9 - 53.9 %   MCV 89.8 80.0 - 100.0 fL   MCH 27.3 26.0 - 34.0 pg   MCHC 30.4 30.0 - 36.0 g/dL   RDW 85.7 88.4 - 84.4 %   Platelets 215 150 - 400 K/uL   nRBC 0.0 0.0 - 0.2 %   Neutrophils Relative % 62 %   Neutro Abs 2.2 1.7 - 7.7 K/uL   Lymphocytes Relative 24 %   Lymphs Abs 0.8 0.7 - 4.0 K/uL   Monocytes Relative 10 %   Monocytes Absolute 0.3 0.1 - 1.0 K/uL   Eosinophils Relative 3 %   Eosinophils Absolute 0.1 0.0 - 0.5 K/uL   Basophils Relative 1 %   Basophils Absolute 0.0 0.0 - 0.1 K/uL   Immature Granulocytes 0 %   Abs Immature Granulocytes 0.01 0.00 - 0.07 K/uL  Prepare RBC (crossmatch)     Status: None   Collection Time: 09/04/24 12:50 PM  Result Value Ref Range   Order Confirmation      ORDER PROCESSED BY BLOOD BANK Performed at [facility], [address] [place], [place] [address]      Recent Labs     09/03/24 1228 09/03/24 1755 09/04/24 0218 09/04/24 0822  WBC 4.7  --   --  3.5*  HGB 5.4* 5.9* 6.9* 7.0*  HCT 18.3* 19.8* 22.3* 23.0*  PLT 248  --   --  215   BMET Recent Labs    09/03/24 1228  NA 139  K 3.5  CL 103  CO2 22  GLUCOSE 127*  BUN 19  CREATININE 0.70  CALCIUM 8.4*   LFT Recent Labs    09/03/24 1755  PROT 5.4*  ALBUMIN 2.8*  AST 21  ALT 16  ALKPHOS 46  BILITOT 0.4  BILIDIR <0.1  IBILI NOT CALCULATED   PT/INR Recent Labs    09/03/24 1346  LABPROT 20.7*  INR 1.7*   Hepatitis Panel No results for input(s): HEPBSAG, HCVAB, HEPAIGM, HEPBIGM in the last 72 hours. C-Diff No results for input(s): CDIFFTOX in the last 72 hours. No results for input(s): CDIFFPCR in the last 72 hours.   Studies/Results: CT ANGIO GI BLEED Result Date: 09/03/2024 CLINICAL DATA:  Acute lower GI bleeding. EXAM: CTA ABDOMEN AND PELVIS WITHOUT AND WITH CONTRAST TECHNIQUE: Multidetector CT imaging of the abdomen and pelvis was performed using the standard protocol during bolus administration of intravenous contrast. Multiplanar reconstructed images and MIPs were obtained and reviewed to evaluate the vascular anatomy. RADIATION DOSE REDUCTION: This exam was performed according to the departmental dose-optimization program which includes automated exposure control, adjustment of the mA and/or kV according to patient size and/or use of iterative reconstruction technique. CONTRAST:  OMNIPAQUE IOHEXOL 350 MG/ML SOLN COMPARISON:  CT chest 12/04/2011.  Chest radiograph 12/06/2011 FINDINGS: VASCULAR Aorta: Normal caliber aorta without aneurysm, dissection, vasculitis or significant stenosis. Diffuse aortic calcification. Celiac: Patent without evidence of aneurysm, dissection, vasculitis or significant stenosis. SMA: Patent without evidence of aneurysm, dissection, vasculitis or significant stenosis. Renals: Both renal arteries are patent without evidence of aneurysm,  dissection, vasculitis, fibromuscular dysplasia or significant stenosis. IMA: Patent without evidence of aneurysm, dissection, vasculitis or significant stenosis. Inflow: Patent without evidence of aneurysm, dissection, vasculitis or significant stenosis. Proximal Outflow: Bilateral common femoral and visualized portions of the superficial and profunda femoral arteries are patent without evidence of aneurysm, dissection, vasculitis or significant stenosis. Veins: No obvious venous abnormality within  the limitations of this arterial phase study. Review of the MIP images confirms the above findings. NON-VASCULAR Lower chest: Mild dependent changes in the lung bases. Hepatobiliary: No focal liver abnormality is seen. No gallstones, gallbladder wall thickening, or biliary dilatation. Pancreas: Unremarkable. No pancreatic ductal dilatation or surrounding inflammatory changes. Spleen: Normal in size without focal abnormality. Adrenals/Urinary Tract: Adrenal glands are unremarkable. Kidneys are normal, without renal calculi, focal lesion, or hydronephrosis. Bladder is unremarkable. Stomach/Bowel: Stomach, small bowel, and colon are not abnormally distended. No wall thickening or inflammatory stranding identified. Colonic diverticulosis without evidence of acute diverticulitis. Appendix is normal. No areas of abnormal intraluminal contrast extravasation or contrast pooling or identified. No focal site of active gastrointestinal bleeding is identified. Increased density demonstrated within multiple colonic diverticula consistent with inspissated stool. Lymphatic: No significant lymphadenopathy. Reproductive: Uterus and bilateral adnexa are unremarkable. Other: No abdominal wall hernia or abnormality. No abdominopelvic ascites. Musculoskeletal: No acute or significant osseous findings. IMPRESSION: VASCULAR Diffuse aortic atherosclerosis. No aneurysm, dissection, or critical stenosis demonstrated in the major abdominal/pelvic  arterial system. NON-VASCULAR 1. No evidence of bowel obstruction or inflammation. Colonic diverticulosis without evidence of acute diverticulitis. 2. No focal area of active gastrointestinal hemorrhage is identified. Electronically Signed   By: Elsie Gravely M.D.   On: 09/03/2024 16:02    Scheduled Inpatient Medications:    sodium chloride   Intravenous Once   sodium chloride   Intravenous Once   escitalopram   5 mg Oral Daily   melatonin  5 mg Oral QHS   pantoprazole (PROTONIX) IV  40 mg Intravenous Q12H   rOPINIRole  0.25 mg Oral QHS   topiramate  50 mg Oral Daily    Continuous Inpatient Infusions:    PRN Inpatient Medications:  acetaminophen **OR** acetaminophen, traMADol  Miscellaneous:   Assessment:  Principal Problem:   Acute GI bleeding Active Problems:   GI bleed Chronic anticoagulation, most recently with Dagitraban. Today is day #1 off the [facility] Lower GI bleed, slowly resolving. Ddx includes colonic diverticular blee  Plan:  Continue holding AC. Continue liquid diet. Will advance to full liquids today. Clears beginning tomorrow. Anticipate Colonoscopy (if patient does not sign out AMA) on Monday. Following along.  Allayah Raineri K. Aundria, M.D. 09/04/2024, 1:44 PM

## 2024-09-04 NOTE — Care Management CC44 (Signed)
 Condition Code 44 Documentation Completed  Patient Details  Name: Aaleah Hirsch MRN: [id number] Date of Birth: 05/30/1951   Condition Code 44 given:  Yes Patient signature on Condition Code 44 notice:  Yes Documentation of 2 MD's agreement:  Yes Code 44 added to claim:  Yes    Corrie JINNY Ruts, LCSW 09/04/2024, 12:50 PM

## 2024-09-04 NOTE — Care Management Obs Status (Signed)
 MEDICARE OBSERVATION STATUS NOTIFICATION   Patient Details  Name: Monique [name] MRN: [id number] Date of Birth: 1951-11-17   Medicare Observation Status Notification Given:  Yes    Corrie JINNY Ruts, LCSW 09/04/2024, 12:50 PM

## 2024-09-04 NOTE — Plan of Care (Signed)

## 2024-09-04 NOTE — Progress Notes (Signed)
 Patient has medications locked up with pharmacy and valuables in security.

## 2024-09-05 LAB — CBC WITH DIFFERENTIAL/PLATELET
Abs Immature Granulocytes: 0.01 K/uL (ref 0.00–0.07)
Basophils Absolute: 0 K/uL (ref 0.0–0.1)
Basophils Relative: 1 %
Eosinophils Absolute: 0.1 K/uL (ref 0.0–0.5)
Eosinophils Relative: 3 %
HCT: 25.1 % — ABNORMAL LOW (ref 36.0–46.0)
Hemoglobin: 8 g/dL — ABNORMAL LOW (ref 12.0–15.0)
Immature Granulocytes: 0 %
Lymphocytes Relative: 25 %
Lymphs Abs: 0.9 K/uL (ref 0.7–4.0)
MCH: 28.1 pg (ref 26.0–34.0)
MCHC: 31.9 g/dL (ref 30.0–36.0)
MCV: 88.1 fL (ref 80.0–100.0)
Monocytes Absolute: 0.5 K/uL (ref 0.1–1.0)
Monocytes Relative: 12 %
Neutro Abs: 2.2 K/uL (ref 1.7–7.7)
Neutrophils Relative %: 59 %
Platelets: 195 K/uL (ref 150–400)
RBC: 2.85 MIL/uL — ABNORMAL LOW (ref 3.87–5.11)
RDW: 13.7 % (ref 11.5–15.5)
WBC: 3.8 K/uL — ABNORMAL LOW (ref 4.0–10.5)
nRBC: 0 % (ref 0.0–0.2)

## 2024-09-05 LAB — BASIC METABOLIC PANEL WITH GFR
Anion gap: 8 (ref 5–15)
BUN: 9 mg/dL (ref 8–23)
CO2: 22 mmol/L (ref 22–32)
Calcium: 8.4 mg/dL — ABNORMAL LOW (ref 8.9–10.3)
Chloride: 108 mmol/L (ref 98–111)
Creatinine, Ser: 0.54 mg/dL (ref 0.44–1.00)
GFR, Estimated: >60 mL/min (ref >60)
Glucose, Bld: 98 mg/dL (ref 70–99)
Potassium: 3.5 mmol/L (ref 3.5–5.1)
Sodium: 138 mmol/L (ref 135–145)

## 2024-09-05 LAB — HEMOGLOBIN AND HEMATOCRIT, BLOOD
HCT: 27 % — ABNORMAL LOW (ref 36.0–46.0)
HCT: 26.7 % — ABNORMAL LOW (ref 36.0–46.0)
HCT: 25.9 % — ABNORMAL LOW (ref 36.0–46.0)
Hemoglobin: 8.7 g/dL — ABNORMAL LOW (ref 12.0–15.0)
Hemoglobin: 8.5 g/dL — ABNORMAL LOW (ref 12.0–15.0)
Hemoglobin: 8.4 g/dL — ABNORMAL LOW (ref 12.0–15.0)

## 2024-09-05 MED ORDER — NA SULFATE-K SULFATE-MG SULF 17.5-3.13-1.6 GM/177ML PO SOLN
0.5000 | Freq: Once | ORAL | Status: AC
  Administered 2024-09-05: 177 mL via ORAL

## 2024-09-05 MED ORDER — NA SULFATE-K SULFATE-MG SULF 17.5-3.13-1.6 GM/177ML PO SOLN
0.5000 | Freq: Once | ORAL | Status: AC
  Administered 2024-09-05: 177 mL via ORAL
  Filled 2024-09-05 (×2): qty 1

## 2024-09-05 NOTE — Evaluation (Signed)
 Physical Therapy Evaluation Patient Details Name: Monique [name] MRN: [id number] DOB: 02/13/1951 Today's Date: 09/05/2024  History of Present Illness  Monique [name] is a 73 y.o. female presenting to the emergency room with complaints of intermittent rectal bleeding with diarrhea over the last 5 to 7 days since switching anticoagulation from Eliquis  to Pradaxa.  Clinical Impression  Pt is pleasant 73 y.o. female admitted for GI bleed. Pt is A&Ox4. Prior to hospitalization, pt was independent with amb and ADLs without use of AD. Pt continues to demonstrate independence with bed mobility and transfers. SPT provided SBA for safety. Pt able to perform horizontal and vertical head turns without LOB. Pt demonstrates all bed mobility/transfers/ambulation at baseline level. Pt does not require any further PT needs at this time. Pt will be dc in house and does not require follow up. RN aware. Will dc current orders.         If plan is discharge home, recommend the following: A little help with walking and/or transfers   Can travel by private vehicle        Equipment Recommendations None recommended by PT  Recommendations for Other Services       Functional Status Assessment Patient has not had a recent decline in their functional status     Precautions / Restrictions Precautions Precautions: Fall Recall of Precautions/Restrictions: Intact Restrictions Weight Bearing Restrictions Per Provider Order: No      Mobility  Bed Mobility Overal bed mobility: Independent             General bed mobility comments: no assist needed    Transfers Overall transfer level: Independent Equipment used: None               General transfer comment: no assist needed    Ambulation/Gait Ambulation/Gait assistance: Supervision Gait Distance (Feet): 200 Feet Assistive device: None Gait Pattern/deviations: WFL(Within Functional Limits)       General Gait Details: Pt able to  perform vertical and horizontal head turns without LOB. SBA for safety  Stairs            Wheelchair Mobility     Tilt Bed    Modified Rankin (Stroke Patients Only)       Balance Overall balance assessment: No apparent balance deficits (not formally assessed)                                           Pertinent Vitals/Pain Pain Assessment Pain Assessment: No/denies pain    Home Living Family/patient expects to be discharged to:: Private residence Living Arrangements: Alone Available Help at Discharge: Other (Comment) (no real help available) Type of Home: House Home Access: Stairs to enter Entrance Stairs-Rails: None Entrance Stairs-Number of Steps: 1   Home Layout: One level Home Equipment: Production assistant, radio - single point      Prior Function Prior Level of Function : Independent/Modified Independent             Mobility Comments: independent, no device ADLs Comments: independent     Extremity/Trunk Assessment   Upper Extremity Assessment Upper Extremity Assessment: Overall WFL for tasks assessed    Lower Extremity Assessment Lower Extremity Assessment: Overall WFL for tasks assessed    Cervical / Trunk Assessment Cervical / Trunk Assessment: Normal  Communication   Communication Communication: No apparent difficulties    Cognition Arousal: Alert Behavior During Therapy: WFL for tasks assessed/performed  PT - Cognitive impairments: No apparent impairments                       PT - Cognition Comments: Pt is A&Ox4. Pleasant and agreeable to PT session Following commands: Intact       Cueing Cueing Techniques: Verbal cues     General Comments      Exercises Other Exercises Other Exercises: edu on safety when beginning to amb independently and assessing self/dizziness prior to getting up   Assessment/Plan    PT Assessment Patient does not need any further PT services  PT Problem List         PT  Treatment Interventions      PT Goals (Current goals can be found in the Care Plan section)  Acute Rehab PT Goals Patient Stated Goal: to go home safely PT Goal Formulation: With patient Time For Goal Achievement: 09/19/24 Potential to Achieve Goals: Good    Frequency       Co-evaluation               AM-PAC PT 6 Clicks Mobility  Outcome Measure Help needed turning from your back to your side while in a flat bed without using bedrails?: None Help needed moving from lying on your back to sitting on the side of a flat bed without using bedrails?: None Help needed moving to and from a bed to a chair (including a wheelchair)?: None Help needed standing up from a chair using your arms (e.g., wheelchair or bedside chair)?: None Help needed to walk in hospital room?: None Help needed climbing 3-5 steps with a railing? : A Little 6 Click Score: 23    End of Session   Activity Tolerance: Patient tolerated treatment well Patient left: in bed;with call bell/phone within reach Nurse Communication: Mobility status PT Visit Diagnosis: Unsteadiness on feet (R26.81)    Time: 9251-9240 PT Time Calculation (min) (ACUTE ONLY): 11 min   Charges:                 Lygia Olaes, SPT   Desiray Orchard 09/05/2024, 8:46 AM

## 2024-09-05 NOTE — Progress Notes (Signed)
 Progress Note   Patient: Monique [name] FMW:[id number] DOB: 06-22-1951 DOA: 09/03/2024     1 DOS: the patient was seen and examined on 09/05/2024     Brief hospital course:  From HPI Monique [name] is a 73 y.o. year old female with past medical history of iron deficiency anemia, DVT, restless leg syndrome, and MDD presenting to the ED with rectal bleeding.  Patient had a DVT that was diagnosed in June 2025 and was started on Eliquis .  She had difficulty affording Eliquis  and was getting samples.  Recently this was switched over to dabigatran.  This was on 10/01.  Patient followed with oncology and they recommended continuing low-dose Eliquis  indefinitely.  Patient has refused hypercoagulable workup.     Patient states she started taking the new blood thinner starting on 10/03 and noted bleeding with bowel movements and diarrhea.    ED Course: On arrival to the ED patient was noted to be hemodynamically stable.  CBC showed hemoglobin at 5.6, baseline around 10.3.  2 units ordered by EDP.  TRH consulted for admission.     Assessment and Plan:      ABLA 2/2 Lower GI Bleed Hx IDA Pt with declining hgb but HDS suspect lower GIB given hematochezia and normal BUN and gradual decline over 5 days. Pt without hx of GIB. Denies NSAID use. States never had colonoscopy or endoscopy.  -GI consult, appreciate their assistance -Maintain adequate acess with 2 large bore PIV -IV PPI 40 mg BID - Gastroenterologist on board and planning colonoscopy hopefully on Monday Continue to monitor H&H Status post 2 units of blood transfusion     Chronic Problems: MDD: restart home SSRI Insomnia: Currently not taking any pharmacotherapy. Will benefit from CBT outpatient. Melatonin ordered.  DVT: Holding AC. Per oncology, she will need indefinite AC at low doses. Will hold now an await GI findings.  RLS: Pt on ropinirole 0.25 mg tablet nightly. Will restart this. Treating IDA will help with this as well.  Repeat iron studies post transfusion. Ferritin goal >75. Ferritin <10 when checked on 09/01/24.   VTE prophylaxis:  SCDs    Code Status:  Full Code       Family Communication: Updated at bedside    Consults called: GI        Subjective:  She did have some bleeding this morning She denies any acute bleeding overnight and this morning   Physical Exam:   Gen: NAD, pleasant elderly female  HENT: Dry MM, pale conjunctiva  CV: RRR, diminished pedal pusles Resp: CTAB Abd: No TTP, normal bowel sounds MSK: no asymmetry Skin: no rashes or bruising noted on skin Neuro: alert and oriented x4 Psych: normal mood     Data Reviewed:    Latest Ref Rng & Units 09/05/2024    3:09 PM 09/05/2024    8:39 AM 09/05/2024    2:48 AM  CBC  WBC 4.0 - 10.5 K/uL   3.8   Hemoglobin 12.0 - 15.0 g/dL 8.5  8.7  8.0   Hematocrit 36.0 - 46.0 % 26.7  27.0  25.1   Platelets 150 - 400 K/uL   195        Latest Ref Rng & Units 09/05/2024    2:48 AM 09/03/2024   12:28 PM 06/14/2024    1:43 PM  BMP  Glucose 70 - 99 mg/dL 98  872  91   BUN 8 - 23 mg/dL 9  19  11    Creatinine 0.44 - 1.00 mg/dL  0.54  0.70  0.67   BUN/Creat Ratio 12 - 28   16   Sodium 135 - 145 mmol/L 138  139  140   Potassium 3.5 - 5.1 mmol/L 3.5  3.5  4.4   Chloride 98 - 111 mmol/L 108  103  106   CO2 22 - 32 mmol/L 22  22  22    Calcium 8.9 - 10.3 mg/dL 8.4  8.4  9.3      Vitals:   09/04/24 1750 09/04/24 1935 09/05/24 0308 09/05/24 0740  BP: 119/66 (!) 142/66 120/64 121/64  Pulse: 85 83 73 81  Resp: 16 15 16 16   Temp: 98.7 F (37.1 C) 98.9 F (37.2 C) (!) 97.5 F (36.4 C) 98.4 F (36.9 C)  TempSrc: Oral Oral  Oral  SpO2: 100% 97% 97% 98%  Weight:      Height:        Author: Drue ONEIDA Potter, MD 09/05/2024 3:52 PM  For on call review [email or web address].

## 2024-09-05 NOTE — Plan of Care (Signed)

## 2024-09-05 NOTE — Progress Notes (Signed)
 [facility] Gastroenterology Inpatient Progress Note    Subjective: Patient seen for follow up of GI bleeding. Patient without acute complaints. No BM since yesterday. Denies abdominal pain.  Objective: Vital signs in last 24 hours: Temp:  [97.5 F (36.4 C)-98.9 F (37.2 C)] 98.4 F (36.9 C) (10/12 0740) Pulse Rate:  [73-85] 81 (10/12 0740) Resp:  [15-19] 16 (10/12 0740) BP: (106-142)/(51-66) 121/64 (10/12 0740) SpO2:  [97 %-100 %] 98 % (10/12 0740) Blood pressure 121/64, pulse 81, temperature 98.4 F (36.9 C), temperature source Oral, resp. rate 16, height 5' 5 (1.651 m), weight 54.4 kg, SpO2 98%.    Intake/Output from previous day: 10/11 0701 - 10/12 0700 In: 480 [P.O.:200; Blood:280] Out: -   Intake/Output this shift: Total I/O In: 200 [P.O.:200] Out: -    Gen: NAD. Appears comfortable.  HEENT: [place]/AT. PERRLA. Normal external ear exam.  Chest: CTA, no wheezes.  CV: RR nl S1, S2. No gallops.  Abd: soft, nt, nd. BS+. Very mild RLQ tenderness without rebound or guarding.  Ext: no edema. Pulses 2+  Neuro: Alert and oriented. Judgement appears normal. Nonfocal.   Lab Results: Results for orders placed or performed during the hospital encounter of 09/03/24 (from the past 24 hours)  Hemoglobin and hematocrit, blood     Status: Abnormal   Collection Time: 09/04/24  8:25 PM  Result Value Ref Range   Hemoglobin 8.5 (L) 12.0 - 15.0 g/dL   HCT 74.0 (L) 63.9 - 53.9 %  Basic metabolic panel     Status: Abnormal   Collection Time: 09/05/24  2:48 AM  Result Value Ref Range   Sodium 138 135 - 145 mmol/L   Potassium 3.5 3.5 - 5.1 mmol/L   Chloride 108 98 - 111 mmol/L   CO2 22 22 - 32 mmol/L   Glucose, Bld 98 70 - 99 mg/dL   BUN 9 8 - 23 mg/dL   Creatinine, Ser 9.45 0.44 - 1.00 mg/dL   Calcium 8.4 (L) 8.9 - 10.3 mg/dL   GFR, Estimated >39 >39 mL/min   Anion gap 8 5 - 15  CBC with Differential/Platelet     Status: Abnormal   Collection Time: 09/05/24  2:48 AM   Result Value Ref Range   WBC 3.8 (L) 4.0 - 10.5 K/uL   RBC 2.85 (L) 3.87 - 5.11 MIL/uL   Hemoglobin 8.0 (L) 12.0 - 15.0 g/dL   HCT 74.8 (L) 63.9 - 53.9 %   MCV 88.1 80.0 - 100.0 fL   MCH 28.1 26.0 - 34.0 pg   MCHC 31.9 30.0 - 36.0 g/dL   RDW 86.2 88.4 - 84.4 %   Platelets 195 150 - 400 K/uL   nRBC 0.0 0.0 - 0.2 %   Neutrophils Relative % 59 %   Neutro Abs 2.2 1.7 - 7.7 K/uL   Lymphocytes Relative 25 %   Lymphs Abs 0.9 0.7 - 4.0 K/uL   Monocytes Relative 12 %   Monocytes Absolute 0.5 0.1 - 1.0 K/uL   Eosinophils Relative 3 %   Eosinophils Absolute 0.1 0.0 - 0.5 K/uL   Basophils Relative 1 %   Basophils Absolute 0.0 0.0 - 0.1 K/uL   Immature Granulocytes 0 %   Abs Immature Granulocytes 0.01 0.00 - 0.07 K/uL  Hemoglobin and hematocrit, blood     Status: Abnormal   Collection Time: 09/05/24  8:39 AM  Result Value Ref Range   Hemoglobin 8.7 (L) 12.0 - 15.0 g/dL   HCT 72.9 (L) 63.9 -  46.0 %     Recent Labs    09/03/24 1228 09/03/24 1755 09/04/24 0822 09/04/24 1427 09/04/24 2025 09/05/24 0248 09/05/24 0839  WBC 4.7  --  3.5*  --   --  3.8*  --   HGB 5.4*   < > 7.0*   < > 8.5* 8.0* 8.7*  HCT 18.3*   < > 23.0*   < > 25.9* 25.1* 27.0*  PLT 248  --  215  --   --  195  --    < > = values in this interval not displayed.   BMET Recent Labs    09/03/24 1228 09/05/24 0248  NA 139 138  K 3.5 3.5  CL 103 108  CO2 22 22  GLUCOSE 127* 98  BUN 19 9  CREATININE 0.70 0.54  CALCIUM 8.4* 8.4*   LFT Recent Labs    09/03/24 1755  PROT 5.4*  ALBUMIN 2.8*  AST 21  ALT 16  ALKPHOS 46  BILITOT 0.4  BILIDIR <0.1  IBILI NOT CALCULATED   PT/INR Recent Labs    09/03/24 1346  LABPROT 20.7*  INR 1.7*   Hepatitis Panel No results for input(s): HEPBSAG, HCVAB, HEPAIGM, HEPBIGM in the last 72 hours. C-Diff No results for input(s): CDIFFTOX in the last 72 hours. No results for input(s): CDIFFPCR in the last 72 hours.   Studies/Results: CT ANGIO GI  BLEED Result Date: 09/03/2024 CLINICAL DATA:  Acute lower GI bleeding. EXAM: CTA ABDOMEN AND PELVIS WITHOUT AND WITH CONTRAST TECHNIQUE: Multidetector CT imaging of the abdomen and pelvis was performed using the standard protocol during bolus administration of intravenous contrast. Multiplanar reconstructed images and MIPs were obtained and reviewed to evaluate the vascular anatomy. RADIATION DOSE REDUCTION: This exam was performed according to the departmental dose-optimization program which includes automated exposure control, adjustment of the mA and/or kV according to patient size and/or use of iterative reconstruction technique. CONTRAST:  OMNIPAQUE IOHEXOL 350 MG/ML SOLN COMPARISON:  CT chest 12/04/2011.  Chest radiograph 12/06/2011 FINDINGS: VASCULAR Aorta: Normal caliber aorta without aneurysm, dissection, vasculitis or significant stenosis. Diffuse aortic calcification. Celiac: Patent without evidence of aneurysm, dissection, vasculitis or significant stenosis. SMA: Patent without evidence of aneurysm, dissection, vasculitis or significant stenosis. Renals: Both renal arteries are patent without evidence of aneurysm, dissection, vasculitis, fibromuscular dysplasia or significant stenosis. IMA: Patent without evidence of aneurysm, dissection, vasculitis or significant stenosis. Inflow: Patent without evidence of aneurysm, dissection, vasculitis or significant stenosis. Proximal Outflow: Bilateral common femoral and visualized portions of the superficial and profunda femoral arteries are patent without evidence of aneurysm, dissection, vasculitis or significant stenosis. Veins: No obvious venous abnormality within the limitations of this arterial phase study. Review of the MIP images confirms the above findings. NON-VASCULAR Lower chest: Mild dependent changes in the lung bases. Hepatobiliary: No focal liver abnormality is seen. No gallstones, gallbladder wall thickening, or biliary dilatation.  Pancreas: Unremarkable. No pancreatic ductal dilatation or surrounding inflammatory changes. Spleen: Normal in size without focal abnormality. Adrenals/Urinary Tract: Adrenal glands are unremarkable. Kidneys are normal, without renal calculi, focal lesion, or hydronephrosis. Bladder is unremarkable. Stomach/Bowel: Stomach, small bowel, and colon are not abnormally distended. No wall thickening or inflammatory stranding identified. Colonic diverticulosis without evidence of acute diverticulitis. Appendix is normal. No areas of abnormal intraluminal contrast extravasation or contrast pooling or identified. No focal site of active gastrointestinal bleeding is identified. Increased density demonstrated within multiple colonic diverticula consistent with inspissated stool. Lymphatic: No significant lymphadenopathy. Reproductive: Uterus and bilateral  adnexa are unremarkable. Other: No abdominal wall hernia or abnormality. No abdominopelvic ascites. Musculoskeletal: No acute or significant osseous findings. IMPRESSION: VASCULAR Diffuse aortic atherosclerosis. No aneurysm, dissection, or critical stenosis demonstrated in the major abdominal/pelvic arterial system. NON-VASCULAR 1. No evidence of bowel obstruction or inflammation. Colonic diverticulosis without evidence of acute diverticulitis. 2. No focal area of active gastrointestinal hemorrhage is identified. Electronically Signed   By: Elsie Gravely M.D.   On: 09/03/2024 16:02    Scheduled Inpatient Medications:    escitalopram   5 mg Oral Daily   melatonin  5 mg Oral QHS   pantoprazole (PROTONIX) IV  40 mg Intravenous Q12H   rOPINIRole  0.25 mg Oral QHS   topiramate  50 mg Oral Daily    Continuous Inpatient Infusions:    PRN Inpatient Medications:  acetaminophen **OR** acetaminophen, traMADol  Miscellaneous: N/A  Assessment:  Principal Problem:   Acute GI bleeding Active Problems:   GI bleed  Acute on chronic lower GI bleeding.- Initially  occurred with Eliquis  use to a lesser extent (melena) , but worsened after switching to Pradaxa (bright red blood per rectum). Presumed supratherapeutic anticoagulation. Increased aPTT though this is not definitive for supratherapeutic level of pradaxa. Anemia secondary to the above. History of DVT - anticoagulants on hold, today is Day #2 without DOAC. Restless legs syndrome. Anxious mood.  Plan:  EGD and colonoscopy in AM. The patient understands the nature of the planned procedure, indications, risks, alternatives and potential complications including but not limited to bleeding, infection, perforation, damage to internal organs and possible oversedation/side effects from anesthesia. The patient agrees and gives consent to proceed.  Please refer to procedure notes for findings, recommendations and patient disposition/instructions. Dr. Jinny will assume GI care beginning at 7:00 AM Monday. Please call me for any problems before that time.   Era Parr K. Aundria, M.D. 09/05/2024, 3:03 PM

## 2024-09-06 ENCOUNTER — Observation Stay: Payer: Self-pay | Admitting: Anesthesiology

## 2024-09-06 ENCOUNTER — Encounter
Admission: EM | Disposition: A | Discharge status: Home Health | Payer: Self-pay | Source: Home / Self Care | Attending: Internal Medicine

## 2024-09-06 ENCOUNTER — Encounter: Payer: Self-pay | Admitting: Internal Medicine

## 2024-09-06 DIAGNOSIS — K449 Diaphragmatic hernia without obstruction or gangrene: Secondary | ICD-10-CM

## 2024-09-06 DIAGNOSIS — D509 Iron deficiency anemia, unspecified: Secondary | ICD-10-CM

## 2024-09-06 DIAGNOSIS — K2980 Duodenitis without bleeding: Secondary | ICD-10-CM

## 2024-09-06 DIAGNOSIS — K5669 Other partial intestinal obstruction: Secondary | ICD-10-CM

## 2024-09-06 DIAGNOSIS — D490 Neoplasm of unspecified behavior of digestive system: Secondary | ICD-10-CM

## 2024-09-06 DIAGNOSIS — K2289 Other specified disease of esophagus: Secondary | ICD-10-CM

## 2024-09-06 HISTORY — PX: COLONOSCOPY: SHX5424

## 2024-09-06 HISTORY — PX: ESOPHAGOGASTRODUODENOSCOPY: SHX5428

## 2024-09-06 LAB — KOH PREP: KOH Prep: NONE SEEN

## 2024-09-06 LAB — HEMOGLOBIN AND HEMATOCRIT, BLOOD
HCT: 25.2 % — ABNORMAL LOW (ref 36.0–46.0)
HCT: 29 % — ABNORMAL LOW (ref 36.0–46.0)
Hemoglobin: 7.9 g/dL — ABNORMAL LOW (ref 12.0–15.0)
Hemoglobin: 9.2 g/dL — ABNORMAL LOW (ref 12.0–15.0)

## 2024-09-06 SURGERY — EGD (ESOPHAGOGASTRODUODENOSCOPY)
Anesthesia: General

## 2024-09-06 MED ORDER — LIDOCAINE HCL (PF) 2 % IJ SOLN
INTRAMUSCULAR | Status: AC
  Filled 2024-09-06: qty 5

## 2024-09-06 MED ORDER — GLYCOPYRROLATE 0.2 MG/ML IJ SOLN
INTRAMUSCULAR | Status: AC
  Filled 2024-09-06: qty 1

## 2024-09-06 MED ORDER — LIDOCAINE HCL (CARDIAC) PF 100 MG/5ML IV SOSY
PREFILLED_SYRINGE | INTRAVENOUS | Status: DC | PRN
  Administered 2024-09-06: 60 mg via INTRAVENOUS

## 2024-09-06 MED ORDER — SODIUM CHLORIDE 0.9 % IV SOLN: INTRAVENOUS | Status: DC | PRN

## 2024-09-06 MED ORDER — PROPOFOL 1000 MG/100ML IV EMUL
INTRAVENOUS | Status: AC
  Filled 2024-09-06: qty 100

## 2024-09-06 MED ORDER — DEXTROSE-SODIUM CHLORIDE 5-0.45 % IV SOLN: INTRAVENOUS | Status: AC

## 2024-09-06 MED ORDER — PHENYLEPHRINE 80 MCG/ML (10ML) SYRINGE FOR IV PUSH (FOR BLOOD PRESSURE SUPPORT)
PREFILLED_SYRINGE | INTRAVENOUS | Status: AC
  Filled 2024-09-06: qty 10

## 2024-09-06 MED ORDER — PHENYLEPHRINE 80 MCG/ML (10ML) SYRINGE FOR IV PUSH (FOR BLOOD PRESSURE SUPPORT)
PREFILLED_SYRINGE | INTRAVENOUS | Status: DC | PRN
  Administered 2024-09-06: 50 ug via INTRAVENOUS

## 2024-09-06 MED ORDER — PROPOFOL 10 MG/ML IV BOLUS
INTRAVENOUS | Status: DC | PRN
  Administered 2024-09-06: 50 mg via INTRAVENOUS

## 2024-09-06 MED ORDER — GLYCOPYRROLATE 0.2 MG/ML IJ SOLN
INTRAMUSCULAR | Status: DC | PRN
  Administered 2024-09-06: .2 mg via INTRAVENOUS

## 2024-09-06 MED ORDER — PROPOFOL 500 MG/50ML IV EMUL
INTRAVENOUS | Status: DC | PRN
  Administered 2024-09-06: 150 ug/kg/min via INTRAVENOUS

## 2024-09-06 NOTE — Op Note (Signed)
 [facility] Gastroenterology Patient Name: Monique [name] Procedure Date: 09/06/2024 1:47 PM MRN: [id number] Account #: [id number] Date of Birth: July 13, 1951 Admit Type: Inpatient Age: 73 Room: [facility] ENDO ROOM 4 Gender: Female Note Status: Finalized Instrument Name: Colon Scope [phone] Procedure:             Colonoscopy Indications:           Iron deficiency anemia Providers:             Rogelia Copping MD, MD Medicines:             Propofol per Anesthesia Complications:         No immediate complications. Procedure:             Pre-Anesthesia Assessment:                        - Prior to the procedure, a History and Physical was                         performed, and patient medications and allergies were                         reviewed. The patient's tolerance of previous                         anesthesia was also reviewed. The risks and benefits                         of the procedure and the sedation options and risks                         were discussed with the patient. All questions were                         answered, and informed consent was obtained. Prior                         Anticoagulants: The patient has taken no anticoagulant                         or antiplatelet agents. ASA Grade Assessment: II - A                         patient with mild systemic disease. After reviewing                         the risks and benefits, the patient was deemed in                         satisfactory condition to undergo the procedure.                        After obtaining informed consent, the colonoscope was                         passed under direct vision. Throughout the procedure,                         the patient's blood pressure,  pulse, and oxygen                         saturations were monitored continuously. The                         Colonoscope was introduced through the anus and                         advanced to the the cecum, identified  by appendiceal                         orifice and ileocecal valve. The colonoscopy was                         performed without difficulty. The patient tolerated                         the procedure well. The quality of the bowel                         preparation was poor. Findings:      The perianal and digital rectal examinations were normal.      A frond-like/villous partially obstructing large mass was found in the       cecum. The mass was circumferential. Oozing was present. This was       biopsied with a cold forceps for histology. Impression:            - Preparation of the colon was poor.                        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 Biopsied. Recommendation:        - Return patient to hospital ward for ongoing care.                        - Resume previous diet.                        - Continue present medications.                        - Await pathology results.                        - Refer to a Careers adviser. Procedure Code(s):     --- Professional ---                        [phone], Colonoscopy, flexible; with biopsy, single or                         multiple Diagnosis Code(s):     --- Professional ---                        D50.9, Iron deficiency anemia, unspecified  D49.0, Neoplasm of unspecified behavior of digestive                         system CPT copyright 2022 [employer]. All rights reserved. The codes documented in this report are preliminary and upon coder review may  be revised to meet current compliance requirements. Rogelia Copping MD, MD 09/06/2024 2:37:55 PM This report has been signed electronically. Number of Addenda: 0 Note Initiated On: 09/06/2024 1:47 PM Scope Withdrawal Time: 0 hours 3 minutes 38 seconds  Total Procedure Duration: 0 hours 14 minutes 42 seconds  Estimated Blood Loss:  Estimated blood loss: none.      [facility]

## 2024-09-06 NOTE — Op Note (Signed)
 [facility] Patient Name: Monique [name] Procedure Date: 09/06/2024 1:55 PM MRN: [id number] Account #: [id number] Date of Birth: 1951-09-02 Admit Type: Inpatient Age: 73 Room: [facility] ENDO ROOM 4 Gender: Female Note Status: Finalized Instrument Name: Upper GI Scope [phone] Procedure:             Upper GI endoscopy Indications:           Iron deficiency anemia Providers:             Rogelia Copping MD, MD Medicines:             Propofol per Anesthesia Complications:         No immediate complications. Procedure:             Pre-Anesthesia Assessment:                        - Prior to the procedure, a History and Physical was                         performed, and patient medications and allergies were                         reviewed. The patient's tolerance of previous                         anesthesia was also reviewed. The risks and benefits                         of the procedure and the sedation options and risks                         were discussed with the patient. All questions were                         answered, and informed consent was obtained. Prior                         Anticoagulants: The patient has taken no anticoagulant                         or antiplatelet agents. ASA Grade Assessment: II - A                         patient with mild systemic disease. After reviewing                         the risks and benefits, the patient was deemed in                         satisfactory condition to undergo the procedure.                        After obtaining informed consent, the endoscope was                         passed under direct vision. Throughout the procedure,                         the  patient's blood pressure, pulse, and oxygen                         saturations were monitored continuously. The Endoscope                         was introduced through the mouth, and advanced to the                         second part of  duodenum. The upper GI endoscopy was                         accomplished without difficulty. The patient tolerated                         the procedure well. Findings:      A small hiatal hernia was present.      White nummular lesions were noted in the entire esophagus. Cells for       cytology were obtained by brushing.      The stomach was normal.      Localized mild inflammation was found in the duodenal bulb. Impression:            - Small hiatal hernia.                        - White nummular lesions in esophageal mucosa. Cells                         for cytology obtained.                        - Normal stomach.                        - Duodenitis. Recommendation:        - Return patient to hospital ward for ongoing care.                        - Resume previous diet.                        - Continue present medications.                        - Perform a colonoscopy today. Procedure Code(s):     --- Professional ---                        [phone], Esophagogastroduodenoscopy, flexible,                         transoral; diagnostic, including collection of                         specimen(s) by brushing or washing, when performed                         (separate procedure) Diagnosis Code(s):     --- Professional ---                        D50.9,  Iron deficiency anemia, unspecified                        K22.89, Other specified disease of esophagus CPT copyright 2022 [employer]. All rights reserved. The codes documented in this report are preliminary and upon coder review may  be revised to meet current compliance requirements. Rogelia Copping MD, MD 09/06/2024 2:19:19 PM This report has been signed electronically. Number of Addenda: 0 Note Initiated On: 09/06/2024 1:55 PM Estimated Blood Loss:  Estimated blood loss: none.      [facility]

## 2024-09-06 NOTE — Plan of Care (Signed)

## 2024-09-06 NOTE — Progress Notes (Signed)
 Progress Note   Patient: Monique [name] FMW:[id number] DOB: Apr 10, 1951 DOA: 09/03/2024     1 DOS: the patient was seen and examined on 09/06/2024     Brief hospital course:  From HPI Monique [name] is a 73 y.o. year old female with past medical history of iron deficiency anemia, DVT, restless leg syndrome, and MDD presenting to the ED with rectal bleeding.  Patient had a DVT that was diagnosed in June 2025 and was started on Eliquis .  She had difficulty affording Eliquis  and was getting samples.  Recently this was switched over to dabigatran.  This was on 10/01.  Patient followed with oncology and they recommended continuing low-dose Eliquis  indefinitely.  Patient has refused hypercoagulable workup.     Patient states she started taking the new blood thinner starting on 10/03 and noted bleeding with bowel movements and diarrhea.    ED Course: On arrival to the ED patient was noted to be hemodynamically stable.  CBC showed hemoglobin at 5.6, baseline around 10.3.  2 units ordered by EDP.  TRH consulted for admission.     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 Jinny he has spoken with general surgery We will continue to follow-up with general surgery's recommendation Monitor H&H Status post 2 units of blood transfusion    Depressive disorder Continue escitalopram   Insomnia Continue melatonin   History of DVT  We will continue holding AC on account of GI bleed.  Per oncology, she will need indefinite AC at low doses.   Restless leg syndrome  Continue ropinirole     VTE prophylaxis:  SCDs    Code Status:  Full Code       Family Communication: Updated at bedside    Consults called: GI        Subjective:  She did have some bleeding this morning Patient denies nausea  vomiting abdominal pain chest pain   Physical Exam:   Gen: NAD, pleasant elderly female  HENT: Dry MM, pale conjunctiva  CV: RRR, diminished pedal pusles Resp: CTAB Abd: No TTP, normal bowel sounds MSK: no asymmetry Skin: no rashes or bruising noted on skin Neuro: alert and oriented x4 Psych: normal mood   Data Reviewed:    Latest Ref Rng & Units 09/05/2024    2:48 AM 09/03/2024   12:28 PM 06/14/2024    1:43 PM  BMP  Glucose 70 - 99 mg/dL 98  872  91   BUN 8 - 23 mg/dL 9  19  11    Creatinine 0.44 - 1.00 mg/dL 9.45  9.29  9.32   BUN/Creat Ratio 12 - 28   16   Sodium 135 - 145 mmol/L 138  139  140   Potassium 3.5 - 5.1 mmol/L 3.5  3.5  4.4   Chloride 98 - 111 mmol/L 108  103  106   CO2 22 - 32 mmol/L 22  22  22    Calcium 8.9 - 10.3 mg/dL 8.4  8.4  9.3        Latest Ref Rng & Units 09/06/2024    8:19 AM 09/06/2024    3:20 AM 09/05/2024    8:52 PM  CBC  Hemoglobin 12.0 - 15.0 g/dL 9.2  7.9  8.4   Hematocrit 36.0 - 46.0 % 29.0  25.2  25.9  Vitals:   09/06/24 1442 09/06/24 1445 09/06/24 1452 09/06/24 1502  BP: (!) 104/53  (!) 107/54 125/65  Pulse: 94  95 94  Resp: 16  20 18   Temp: 98.2 F (36.8 C) 98.3 F (36.8 C)    TempSrc: Temporal Temporal    SpO2: 99%  100% 96%  Weight:      Height:         Author: Drue ONEIDA Potter, MD 09/06/2024 5:02 PM  For on call review [email or web address].

## 2024-09-06 NOTE — Progress Notes (Signed)
 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  I have notified the hospitalist and have put a chat message to the surgeon for the patient to be evaluated.  Nothing further to do from GI point of view.  I will sign off.  Please call if any further GI concerns or questions.  We would like to thank you for the opportunity to participate in the care of Monique [name].

## 2024-09-06 NOTE — Progress Notes (Signed)
 Mobility Specialist Progress Note:    09/06/24 9177  Mobility  Activity Ambulated with assistance  Level of Assistance Standby assist, set-up cues, supervision of patient - no hands on  Assistive Device None  Distance Ambulated (ft) 200 ft  Range of Motion/Exercises Active;All extremities  Activity Response Tolerated well  Mobility visit 1 Mobility  Mobility Specialist Start Time (ACUTE ONLY) 0809  Mobility Specialist Stop Time (ACUTE ONLY) 0820  Mobility Specialist Time Calculation (min) (ACUTE ONLY) 11 min   Pt received in bed, agreeable to mobility. Required supervision to stand and ambulate with no AD. Tolerated well, c/o feeling weak. Returned to room, left supine. All needs met.  Sherrilee Ditty Mobility Specialist Please contact via Special educational needs teacher or  Rehab office at [phone]

## 2024-09-06 NOTE — Anesthesia Preprocedure Evaluation (Signed)
 Anesthesia Evaluation  Patient identified by MRN, date of birth, ID band Patient awake    Reviewed: Allergy & Precautions, NPO status , Patient's Chart, lab work & pertinent test results  History of Anesthesia Complications Negative for: history of anesthetic complications  Airway Mallampati: III  TM Distance: >3 FB Neck ROM: full    Dental no notable dental hx.    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  Past Surgical History: No date: BREAST LUMPECTOMY     Comment:  Rt breast  BMI    Body Mass Index: 19.80 kg/m      Reproductive/Obstetrics negative OB ROS                              Anesthesia Physical Anesthesia Plan  ASA: 3  Anesthesia Plan: General   Post-op Pain Management: Minimal or no pain anticipated   Induction: Intravenous  PONV Risk Score and Plan: 2 and Propofol infusion and TIVA  Airway Management Planned: Natural Airway and Nasal Cannula  Additional Equipment:   Intra-op Plan:   Post-operative Plan:   Informed Consent: I have reviewed the patients History and Physical, chart, labs and discussed the procedure including the risks, benefits and alternatives for the proposed anesthesia with the patient or authorized representative who has indicated his/her understanding and acceptance.     Dental Advisory Given  Plan Discussed with:  Anesthesiologist, CRNA and Surgeon  Anesthesia Plan Comments: (Patient consented for risks of anesthesia including but not limited to:  - adverse reactions to medications - risk of airway placement if required - damage to eyes, teeth, lips or other oral mucosa - nerve damage due to positioning  - sore throat or hoarseness - Damage to heart, brain, nerves, lungs, other parts of body or loss of life  Patient voiced understanding and assent.)        Anesthesia Quick Evaluation

## 2024-09-06 NOTE — Anesthesia Postprocedure Evaluation (Signed)
 Anesthesia Post Note  Patient: Monique [name]  Procedure(s) Performed: EGD (ESOPHAGOGASTRODUODENOSCOPY) COLONOSCOPY  Patient location during evaluation: PACU Anesthesia Type: General Level of consciousness: awake and alert Pain management: pain level controlled Vital Signs Assessment: post-procedure vital signs reviewed and stable Respiratory status: spontaneous breathing, nonlabored ventilation and respiratory function stable Cardiovascular status: blood pressure returned to baseline and stable Postop Assessment: no apparent nausea or vomiting Anesthetic complications: no   There were no known notable events for this encounter.   Last Vitals:  Vitals:   09/06/24 1452 09/06/24 1502  BP: (!) 107/54 125/65  Pulse: 95 94  Resp: 20 18  Temp:    SpO2: 100% 96%    Last Pain:  Vitals:   09/06/24 1502  TempSrc:   PainSc: 0-No pain                 Camellia Merilee Louder

## 2024-09-06 NOTE — Transfer of Care (Signed)
 Immediate Anesthesia Transfer of Care Note  Patient: Monique [name]  Procedure(s) Performed: EGD (ESOPHAGOGASTRODUODENOSCOPY) COLONOSCOPY  Patient Location: PACU  Anesthesia Type:General  Level of Consciousness: awake and sedated  Airway & Oxygen Therapy: Patient Spontanous Breathing and Patient connected to nasal cannula oxygen  Post-op Assessment: Report given to RN and Post -op Vital signs reviewed and stable  Post vital signs: Reviewed and stable  Last Vitals:  Vitals Value Taken Time  BP    Temp    Pulse    Resp    SpO2      Last Pain:  Vitals:   09/06/24 1250  TempSrc: Tympanic  PainSc: 0-No pain      Patients Stated Pain Goal: 0 (09/05/24 2045)  Complications: There were no known notable events for this encounter.

## 2024-09-07 ENCOUNTER — Observation Stay

## 2024-09-07 ENCOUNTER — Encounter: Payer: Self-pay | Admitting: Gastroenterology

## 2024-09-07 DIAGNOSIS — D490 Neoplasm of unspecified behavior of digestive system: Secondary | ICD-10-CM

## 2024-09-07 DIAGNOSIS — C180 Malignant neoplasm of cecum: Secondary | ICD-10-CM

## 2024-09-07 LAB — BPAM RBC
Blood Product Expiration Date: 202511102359
Blood Product Expiration Date: 202511142359
Blood Product Expiration Date: 202511152359
Blood Product Expiration Date: 202511152359
ISSUE DATE / TIME: 202510101435
ISSUE DATE / TIME: 202510102105
ISSUE DATE / TIME: 202510111524
Unit Type and Rh: 5100
Unit Type and Rh: 5100
Unit Type and Rh: 5100
Unit Type and Rh: 5100

## 2024-09-07 LAB — TYPE AND SCREEN
ABO/RH(D): O POS
Antibody Screen: NEGATIVE
Unit division: 0
Unit division: 0
Unit division: 0
Unit division: 0

## 2024-09-07 LAB — BASIC METABOLIC PANEL WITH GFR
Anion gap: 4 — ABNORMAL LOW (ref 5–15)
BUN: 7 mg/dL — ABNORMAL LOW (ref 8–23)
CO2: 26 mmol/L (ref 22–32)
Calcium: 8.2 mg/dL — ABNORMAL LOW (ref 8.9–10.3)
Chloride: 110 mmol/L (ref 98–111)
Creatinine, Ser: 0.56 mg/dL (ref 0.44–1.00)
GFR, Estimated: >60 mL/min (ref >60)
Glucose, Bld: 91 mg/dL (ref 70–99)
Potassium: 3.4 mmol/L — ABNORMAL LOW (ref 3.5–5.1)
Sodium: 140 mmol/L (ref 135–145)

## 2024-09-07 LAB — CBC WITH DIFFERENTIAL/PLATELET
Abs Immature Granulocytes: 0.01 K/uL (ref 0.00–0.07)
Basophils Absolute: 0 K/uL (ref 0.0–0.1)
Basophils Relative: 1 %
Eosinophils Absolute: 0.2 K/uL (ref 0.0–0.5)
Eosinophils Relative: 4 %
HCT: 23.8 % — ABNORMAL LOW (ref 36.0–46.0)
Hemoglobin: 7.8 g/dL — ABNORMAL LOW (ref 12.0–15.0)
Immature Granulocytes: 0 %
Lymphocytes Relative: 26 %
Lymphs Abs: 1 K/uL (ref 0.7–4.0)
MCH: 28.5 pg (ref 26.0–34.0)
MCHC: 32.8 g/dL (ref 30.0–36.0)
MCV: 86.9 fL (ref 80.0–100.0)
Monocytes Absolute: 0.4 K/uL (ref 0.1–1.0)
Monocytes Relative: 11 %
Neutro Abs: 2.2 K/uL (ref 1.7–7.7)
Neutrophils Relative %: 58 %
Platelets: 226 K/uL (ref 150–400)
RBC: 2.74 MIL/uL — ABNORMAL LOW (ref 3.87–5.11)
RDW: 13.4 % (ref 11.5–15.5)
WBC: 3.8 K/uL — ABNORMAL LOW (ref 4.0–10.5)
nRBC: 0 % (ref 0.0–0.2)

## 2024-09-07 LAB — SURGICAL PATHOLOGY

## 2024-09-07 LAB — PREPARE RBC (CROSSMATCH)

## 2024-09-07 MED ORDER — MAGNESIUM CITRATE PO SOLN
1.0000 | Freq: Two times a day (BID) | ORAL | Status: AC
  Administered 2024-09-07 (×2): 1 via ORAL
  Filled 2024-09-07 (×2): qty 296

## 2024-09-07 MED ORDER — POTASSIUM CHLORIDE 20 MEQ PO PACK
40.0000 meq | PACK | Freq: Once | ORAL | Status: AC
  Administered 2024-09-07: 40 meq via ORAL
  Filled 2024-09-07: qty 2

## 2024-09-07 MED ORDER — IOHEXOL 300 MG/ML  SOLN
75.0000 mL | Freq: Once | INTRAMUSCULAR | Status: AC | PRN
  Administered 2024-09-07: 75 mL via INTRAVENOUS

## 2024-09-07 MED ORDER — NEOMYCIN SULFATE 500 MG PO TABS
1000.0000 mg | ORAL_TABLET | Freq: Three times a day (TID) | ORAL | Status: DC
  Administered 2024-09-07 (×2): 1000 mg via ORAL
  Filled 2024-09-07 (×3): qty 2

## 2024-09-07 MED ORDER — METRONIDAZOLE 500 MG PO TABS
500.0000 mg | ORAL_TABLET | Freq: Three times a day (TID) | ORAL | Status: DC
  Administered 2024-09-07 (×2): 500 mg via ORAL
  Filled 2024-09-07 (×3): qty 1

## 2024-09-07 NOTE — Consult Note (Signed)
 Hematology/Oncology Consult note [phone] [phone]      Patient Care Team: Herold Hadassah SQUIBB, MD as PCP - General (Family Medicine) Maree Hue, MD as Referring Physician (Internal Medicine) Babara Call, MD as Consulting Physician (Hematology and Oncology)   Name of the patient: Monique [name]  [id number]  1951-11-06   REASON FOR COSULTATION:  GI bleeding, colon mass History of presenting illness-  73 y.o. female with past medical history including iron deficiency anemia, DVT, on anticoagulation with Pradaxa,who presents to ER for evaluation of rectal bleeding.  She has a history of right lower extremity DVT unprovoked, diagnosed during June 2025.  Patient was previously on Eliquis  5 mg twice daily.  Unfortunately her insurance has high co-pay cost for Eliquis  and she was recently switched to Pradaxa around 08/27/2024.  Since then she has noticed rectal bleeding.  Patient has microcytic anemia.  She has previously declined workup for anemia.  In the emergency room, patient was found to have acute drop of hemoglobin to four 5.4 compared to her baseline hemoglobin of 10.6. Gastroenterology was consulted.  Patient did tell GI physician that bleeding and dark stool began in June after started on Eliquis , but not very often.  Bleeding appeared to accelerate after switching to Pradaxa.  Today patient denies about noticing any stool changes until she switched to Pradaxa.  09/03/2024, patient underwent EGD and colonoscopy. Colonoscopy revealed a frond-like/villous partially obstructing large mass in the cecum.  Mass was circumferential.  Oozing was present.  Mass was biopsied.  Pathology is pending. EGD showed right mucous lesion in the esophagus, biopsy was taken, duodenitis.   09/03/2024, CT angiogram GI bleeding showed no evidence of bowel obstruction or inflammation.  Colonic diverticulosis without evidence of acute diverticulitis.  No focal area of active GI  hemorrhage is identified.  No focal liver abnormality was seen.  Chest CT with contrast showed no evidence of metastatic disease in the chest.  Small focus of clustered nodularity peripherally in the right upper lobe.  Likely inflammatory.  Small pericardial effusion.  Trace ascites.  Mildly enlarged ascending aorta.  Oncologist was consulted.  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) 07/02/2024   Insomnia 04/30/2024     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     Past Surgical History:  Procedure Laterality Date   BREAST LUMPECTOMY     Rt breast    Social History   Socioeconomic History   Marital status: Single    Spouse name: Not on file   Number of children: 1   Years of education: Not on file   Highest education level: Not on file  Occupational History   Occupation: Unemployed  Tobacco Use   Smoking status: Former    Current packs/day: 0.00    Average packs/day: 0.8 packs/day for 42.0 years (33.6 ttl pk-yrs)    Types: Cigarettes  Start date: 12/01/1969    Quit date: 12/02/2011    Years since quitting: 12.7   Smokeless tobacco: Never  Substance and Sexual Activity   Alcohol use: No   Drug use: No   Sexual activity: Not on file  Other Topics Concern   Not on file  Social History Narrative   Not on file   Social Drivers of Health   Financial Resource Strain: Not on file  Food Insecurity: No  Food Insecurity (09/04/2024)   Hunger Vital Sign    Worried About Running Out of Food in the Last Year: Never true    Ran Out of Food in the Last Year: Never true  Transportation Needs: No Transportation Needs (09/04/2024)   PRAPARE - Administrator, Civil Service (Medical): No    Lack of Transportation (Non-Medical): No  Physical Activity: Not on file  Stress: Not on file  Social Connections: Unknown (09/04/2024)   Social Connection and Isolation Panel    Frequency of Communication with Friends and Family: Never    Frequency of Social Gatherings with Friends and Family: Never    Attends Religious Services: Never    Database administrator or Organizations: No    Attends Banker Meetings: Never    Marital Status: Patient declined  Catering manager Violence: Not At Risk (09/04/2024)   Humiliation, Afraid, Rape, and Kick questionnaire    Fear of Current or Ex-Partner: No    Emotionally Abused: No    Physically Abused: No    Sexually Abused: No     Family History  Problem Relation Age of Onset   Emphysema Maternal Grandfather        smoked a pipe   Stomach cancer Maternal Aunt    Lung cancer Maternal Uncle        was a smoker     Current Facility-Administered Medications:    acetaminophen (TYLENOL) tablet 650 mg, 650 mg, Oral, Q6H PRN **OR** acetaminophen (TYLENOL) suppository 650 mg, 650 mg, Rectal, Q6H PRN, Jinny, Darren, MD   escitalopram  (LEXAPRO ) tablet 5 mg, 5 mg, Oral, Daily, Wohl, Darren, MD, 5 mg at 09/07/24 0900   magnesium citrate solution 1 Bottle, 1 Bottle, Oral, BID, Schulz, Zachary R, PA-C, 1 Bottle at 09/07/24 1031   melatonin tablet 5 mg, 5 mg, Oral, QHS, Wohl, Darren, MD, 5 mg at 09/06/24 2107   metroNIDAZOLE (FLAGYL) tablet 500 mg, 500 mg, Oral, Q8H, Terryl, Zachary R, PA-C   neomycin (MYCIFRADIN) tablet 1,000 mg, 1,000 mg, Oral, Q8H, Schulz, Zachary R, PA-C   pantoprazole (PROTONIX) injection 40 mg, 40 mg, Intravenous, Q12H, Wohl,  Darren, MD, 40 mg at 09/07/24 0441   rOPINIRole (REQUIP) tablet 0.25 mg, 0.25 mg, Oral, QHS, Wohl, Darren, MD, 0.25 mg at 09/06/24 2107   topiramate (TOPAMAX) tablet 50 mg, 50 mg, Oral, Daily, Jinny, Darren, MD, 50 mg at 09/07/24 0901   traMADol (ULTRAM) tablet 50 mg, 50 mg, Oral, Q6H PRN, Jinny Carmine, MD  Review of Systems  Constitutional:  Positive for fatigue. Negative for appetite change, chills and fever.  HENT:   Negative for hearing loss and voice change.   Eyes:  Negative for eye problems.  Respiratory:  Negative for chest tightness and cough.   Cardiovascular:  Negative for chest pain.  Gastrointestinal:  Positive for blood in stool. Negative for abdominal distention and abdominal pain.  Endocrine: Negative for hot flashes.  Genitourinary:  Negative for difficulty urinating and frequency.   Musculoskeletal:  Negative for arthralgias.  Skin:  Negative for itching and rash.  Neurological:  Negative for extremity weakness.  Hematological:  Negative for adenopathy.  Psychiatric/Behavioral:  Negative for confusion.     PHYSICAL EXAM Vitals:   09/07/24 0440 09/07/24 0843 09/07/24 1050 09/07/24 1110  BP: (!) 117/55 130/64 (!) 151/76 (!) 153/78  Pulse: 73 83 81 94  Resp: 16 16 18 18   Temp: 98 F (36.7 C) 98.4 F (36.9 C) 98.6 F (37 C) 98.6 F (37 C)  TempSrc: Oral Oral Oral Oral  SpO2: 97% 99% 100% 100%  Weight:      Height:       Physical Exam Constitutional:      General: She is not in acute distress.    Appearance: She is not diaphoretic.  HENT:     Head: Normocephalic and atraumatic.  Eyes:     General: No scleral icterus. Cardiovascular:     Rate and Rhythm: Normal rate and regular rhythm.     Heart sounds: No murmur heard. Pulmonary:     Effort: Pulmonary effort is normal. No respiratory distress.     Breath sounds: No wheezing.  Abdominal:     General: Bowel sounds are normal. There is no distension.     Palpations: Abdomen is soft.  Musculoskeletal:         General: Normal range of motion.     Cervical back: Normal range of motion and neck supple.  Skin:    General: Skin is warm and dry.     Findings: No erythema.  Neurological:     Mental Status: She is alert and oriented to person, place, and time. Mental status is at baseline.     Motor: No abnormal muscle tone.  Psychiatric:        Mood and Affect: Affect normal.       LABORATORY STUDIES    Latest Ref Rng & Units 09/07/2024    4:09 AM 09/06/2024    8:19 AM 09/06/2024    3:20 AM  CBC  WBC 4.0 - 10.5 K/uL 3.8     Hemoglobin 12.0 - 15.0 g/dL 7.8  9.2  7.9   Hematocrit 36.0 - 46.0 % 23.8  29.0  25.2   Platelets 150 - 400 K/uL 226         Latest Ref Rng & Units 09/07/2024    4:09 AM 09/05/2024    2:48 AM 09/03/2024    5:55 PM  CMP  Glucose 70 - 99 mg/dL 91  98    BUN 8 - 23 mg/dL 7  9    Creatinine 9.55 - 1.00 mg/dL 9.43  9.45    Sodium 864 - 145 mmol/L 140  138    Potassium 3.5 - 5.1 mmol/L 3.4  3.5    Chloride 98 - 111 mmol/L 110  108    CO2 22 - 32 mmol/L 26  22    Calcium 8.9 - 10.3 mg/dL 8.2  8.4    Total Protein 6.5 - 8.1 g/dL   5.4   Total Bilirubin 0.0 - 1.2 mg/dL   0.4   Alkaline Phos 38 - 126 U/L   46   AST 15 - 41 U/L   21   ALT 0 - 44 U/L   16      RADIOGRAPHIC STUDIES: I have personally reviewed the radiological images as listed and agreed with the findings in the report. CT ANGIO GI BLEED Result Date: 09/03/2024 CLINICAL DATA:  Acute lower GI bleeding. EXAM: CTA ABDOMEN  AND PELVIS WITHOUT AND WITH CONTRAST TECHNIQUE: Multidetector CT imaging of the abdomen and pelvis was performed using the standard protocol during bolus administration of intravenous contrast. Multiplanar reconstructed images and MIPs were obtained and reviewed to evaluate the vascular anatomy. RADIATION DOSE REDUCTION: This exam was performed according to the departmental dose-optimization program which includes automated exposure control, adjustment of the mA and/or kV according to  patient size and/or use of iterative reconstruction technique. CONTRAST:  OMNIPAQUE IOHEXOL 350 MG/ML SOLN COMPARISON:  CT chest 12/04/2011.  Chest radiograph 12/06/2011 FINDINGS: VASCULAR Aorta: Normal caliber aorta without aneurysm, dissection, vasculitis or significant stenosis. Diffuse aortic calcification. Celiac: Patent without evidence of aneurysm, dissection, vasculitis or significant stenosis. SMA: Patent without evidence of aneurysm, dissection, vasculitis or significant stenosis. Renals: Both renal arteries are patent without evidence of aneurysm, dissection, vasculitis, fibromuscular dysplasia or significant stenosis. IMA: Patent without evidence of aneurysm, dissection, vasculitis or significant stenosis. Inflow: Patent without evidence of aneurysm, dissection, vasculitis or significant stenosis. Proximal Outflow: Bilateral common femoral and visualized portions of the superficial and profunda femoral arteries are patent without evidence of aneurysm, dissection, vasculitis or significant stenosis. Veins: No obvious venous abnormality within the limitations of this arterial phase study. Review of the MIP images confirms the above findings. NON-VASCULAR Lower chest: Mild dependent changes in the lung bases. Hepatobiliary: No focal liver abnormality is seen. No gallstones, gallbladder wall thickening, or biliary dilatation. Pancreas: Unremarkable. No pancreatic ductal dilatation or surrounding inflammatory changes. Spleen: Normal in size without focal abnormality. Adrenals/Urinary Tract: Adrenal glands are unremarkable. Kidneys are normal, without renal calculi, focal lesion, or hydronephrosis. Bladder is unremarkable. Stomach/Bowel: Stomach, small bowel, and colon are not abnormally distended. No wall thickening or inflammatory stranding identified. Colonic diverticulosis without evidence of acute diverticulitis. Appendix is normal. No areas of abnormal intraluminal contrast extravasation or  contrast pooling or identified. No focal site of active gastrointestinal bleeding is identified. Increased density demonstrated within multiple colonic diverticula consistent with inspissated stool. Lymphatic: No significant lymphadenopathy. Reproductive: Uterus and bilateral adnexa are unremarkable. Other: No abdominal wall hernia or abnormality. No abdominopelvic ascites. Musculoskeletal: No acute or significant osseous findings. IMPRESSION: VASCULAR Diffuse aortic atherosclerosis. No aneurysm, dissection, or critical stenosis demonstrated in the major abdominal/pelvic arterial system. NON-VASCULAR 1. No evidence of bowel obstruction or inflammation. Colonic diverticulosis without evidence of acute diverticulitis. 2. No focal area of active gastrointestinal hemorrhage is identified. Electronically Signed   By: Elsie Gravely M.D.   On: 09/03/2024 16:02   US  Venous Img Lower Unilateral Right (DVT) Result Date: 09/02/2024 CLINICAL DATA:  History of known DVT in the right popliteal and calf veins EXAM: RIGHT LOWER EXTREMITY VENOUS DOPPLER ULTRASOUND TECHNIQUE: Gray-scale sonography with graded compression, as well as color Doppler and duplex ultrasound were performed to evaluate the lower extremity deep venous systems from the level of the common femoral vein and including the common femoral, femoral, profunda femoral, popliteal and calf veins including the posterior tibial, peroneal and gastrocnemius veins when visible. The superficial great saphenous vein was also interrogated. Spectral Doppler was utilized to evaluate flow at rest and with distal augmentation maneuvers in the common femoral, femoral and popliteal veins. COMPARISON:  Prior duplex venous ultrasound 05/12/2024 FINDINGS: Contralateral Common Femoral Vein: Respiratory phasicity is normal and symmetric with the symptomatic side. No evidence of thrombus. Normal compressibility. Common Femoral Vein: No evidence of thrombus. Normal compressibility,  respiratory phasicity and response to augmentation. Saphenofemoral Junction: No evidence of thrombus. Normal compressibility and flow on color Doppler imaging.  Profunda Femoral Vein: No evidence of thrombus. Normal compressibility and flow on color Doppler imaging. Femoral Vein: No evidence of thrombus. Normal compressibility, respiratory phasicity and response to augmentation. Popliteal Vein: No evidence of thrombus. Normal compressibility, respiratory phasicity and response to augmentation. Calf Veins: Segmental nonocclusive thrombus remains visible within the posterior tibial veins. Superficial Great Saphenous Vein: No evidence of thrombus. Normal compressibility. Venous Reflux:  None. Other Findings:  None. IMPRESSION: 1. No evidence of acute deep venous thrombosis. 2. Significant interval improvement in previously identified DVT with complete resolution of thrombus from within the popliteal veins. There is a small amount of residual chronic thrombus in the posterior tibial veins within the calf. Electronically Signed   By: Wilkie Lent M.D.   On: 09/02/2024 11:41     Assessment and plan-   # Acute blood loss anemia secondary to GI bleeding  Status post PRBC transfusion 2 units.  Hemoglobin has improved. She is currently getting her third units to further optimize hemoglobin prior to the surgery.  # Cecal mass, colonoscopy findings were reviewed and discussed with patient.  Pathology is pending. CT shows no distant metastasis. Check CEA. Patient has been evaluated by surgery and there is plan for hemicolectomy tomorrow. Outpatient follow-up with oncology . # History of DVT, anticoagulation is on hold. 09/02/24  right lower extremity ultrasound showed no evidence of acute DVT.  Significant interval improvement, with small amount of residual chronic thrombus in the posterior tibial vein. I recommend to hold off anticoagulation due to concerns of bleeding. She will follow-up outpatient to go  over pathology results and finalize anticoagulation recommendation.  Thank you for allowing me to participate in the care of this patient.   Zelphia Cap, MD, PhD Hematology Oncology 09/07/2024

## 2024-09-07 NOTE — Consult Note (Signed)
 [facility] SURGICAL CONSULTATION NOTE (initial) - cpt: 00746   HISTORY OF PRESENT ILLNESS (HPI):  73 y.o. female presented to [facility] ED initially on 10/10 secondary to complaints of rectal bleeding. She believes this initially started around June after she was started on Eliquis  for a DVT. She was recently switched to Dabigatran 10 days ago and she believes this has worsened the bleeding. Stools have been quite loose as well over this time. She reports mild RLQ abdominal discomfort but no fever, chills, nausea, emesis. She does report weight loss but had been on a liquid diet at home. No previous abdominal surgeries. In the ED, she was found to be anemic with a Hgb of 5.4. She had a CT for GI bleed without gross source. She was admitted to medicine service. She has received 3 units pRBCs to this point during this admission. GI was consulted and she underwent colonoscopy on 10/13 and this revealed a cecal mass. Pathology remains pending.   This morning, she is doing well. Hgb continues to decline, now to 7.8 (from 9.2 yesterday).   Surgery is consulted by hospitalist physician Dr. Dorinda Homans, MD in this context for evaluation and management of cecal mass.  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 Authorizing Provider  dabigatran (PRADAXA) 150 MG CAPS capsule Take 1 capsule (150 mg total) by mouth 2 (two) times daily. Patient does not have insurance- please use GoodRx: BIN [id number], PCN [id number], ID [id number], Grp GDRX 08/24/24  Yes Herold Hadassah SQUIBB, MD  escitalopram  (LEXAPRO ) 5 MG tablet Take 1 tablet (5 mg total) by mouth daily.  07/02/24  Yes Herold Hadassah SQUIBB, MD  Prenatal MV-Min-Fe Fum-FA-DHA (PRENATAL/FOLIC ACID+DHA PO) Take 1 tablet by mouth daily.   Yes [provider]  topiramate (TOPAMAX) 50 MG tablet Take 1 tablet by mouth daily. 03/22/24  Yes [provider]  Doxepin  HCl 6 MG TABS Take 0.5-1 tablets (3-6 mg total) by mouth at bedtime as needed (insomnia). Patient not taking: Reported on 09/03/2024 08/19/24   Herold Hadassah SQUIBB, MD     ALLERGIES:  Allergies  Allergen Reactions   Penicillins     bleeding     SOCIAL HISTORY:  Social History   Socioeconomic History   Marital status: Single    Spouse name: Not on file   Number of children: 1   Years of education: Not on file   Highest education level: Not on file  Occupational History   Occupation: Unemployed  Tobacco Use   Smoking status: Former    Current packs/day: 0.00    Average packs/day: 0.8 packs/day for 42.0 years (33.6 ttl pk-yrs)    Types: Cigarettes    Start date: 12/01/1969    Quit date: 12/02/2011    Years since quitting: 12.7   Smokeless tobacco: Never  Substance and Sexual Activity   Alcohol use: No   Drug use: No   Sexual activity: Not on file  Other Topics Concern   Not on file  Social History Narrative   Not on file   Social Drivers of Health   Financial  Resource Strain: Not on file  Food Insecurity: No Food Insecurity (09/04/2024)   Hunger Vital Sign    Worried About Running Out of Food in the Last Year: Never true    Ran Out of Food in the Last Year: Never true  Transportation Needs: No Transportation Needs (09/04/2024)   PRAPARE - Administrator, Civil Service (Medical): No    Lack of Transportation (Non-Medical): No  Physical Activity: Not on file  Stress: Not on file  Social Connections: Unknown (09/04/2024)   Social Connection and Isolation Panel    Frequency of Communication with Friends and Family: Never    Frequency of Social Gatherings with Friends and Family: Never    Attends  Religious Services: Never    Database administrator or Organizations: No    Attends Banker Meetings: Never    Marital Status: Patient declined  Catering manager Violence: Not At Risk (09/04/2024)   Humiliation, Afraid, Rape, and Kick questionnaire    Fear of Current or Ex-Partner: No    Emotionally Abused: No    Physically Abused: No    Sexually Abused: No     FAMILY HISTORY:  Family History  Problem Relation Age of Onset   Emphysema Maternal Grandfather        smoked a pipe   Stomach cancer Maternal Aunt    Lung cancer Maternal Uncle        was a smoker      REVIEW OF SYSTEMS:  Review of Systems  Constitutional:  Negative for chills and fever.  Respiratory:  Negative for cough and shortness of breath.   Cardiovascular:  Negative for chest pain and palpitations.  Gastrointestinal:  Positive for blood in stool and diarrhea. Negative for abdominal pain, nausea and vomiting.  Genitourinary:  Negative for dysuria and urgency.  All other systems reviewed and are negative.   VITAL SIGNS:  Temp:  [97.4 F (36.3 C)-98.9 F (37.2 C)] 98.4 F (36.9 C) (10/14 0843) Pulse Rate:  [73-95] 83 (10/14 0843) Resp:  [15-20] 16 (10/14 0843) BP: (104-135)/(53-74) 130/64 (10/14 0843) SpO2:  [96 %-100 %] 99 % (10/14 0843) Weight:  [54 kg] 54 kg (10/13 1250)     Height: 5' 5 (165.1 cm) Weight: 54 kg BMI (Calculated): 19.8   INTAKE/OUTPUT:  10/13 0701 - 10/14 0700 In: 398.3 [P.O.:200; I.V.:198.3] Out: -   PHYSICAL EXAM:  Physical Exam Vitals and nursing note reviewed. Exam conducted with a chaperone present.  Constitutional:      General: She is not in acute distress.    Appearance: Normal appearance. She is normal weight. She is not ill-appearing.     Comments: Resting in bed; NAD  HENT:     Head: Normocephalic and atraumatic.  Eyes:     General: No scleral icterus.    Conjunctiva/sclera: Conjunctivae normal.  Cardiovascular:     Rate and Rhythm: Normal rate.      Pulses: Normal pulses.     Heart sounds: No murmur heard. Pulmonary:     Effort: Pulmonary effort is normal. No respiratory distress.     Breath sounds: Normal breath sounds.  Abdominal:     General: Abdomen is flat. There is no distension.     Palpations: Abdomen is soft.     Tenderness: There is no abdominal tenderness. There is no guarding or rebound.  Genitourinary:    Comments: Deferred Musculoskeletal:     Right lower leg: No edema.     Left lower leg:  No edema.  Skin:    General: Skin is warm and dry.  Neurological:     General: No focal deficit present.     Mental Status: She is alert and oriented to person, place, and time. Mental status is at baseline.  Psychiatric:        Mood and Affect: Mood normal.        Behavior: Behavior normal.      Labs:     Latest Ref Rng & Units 09/07/2024    4:09 AM 09/06/2024    8:19 AM 09/06/2024    3:20 AM  CBC  WBC 4.0 - 10.5 K/uL 3.8     Hemoglobin 12.0 - 15.0 g/dL 7.8  9.2  7.9   Hematocrit 36.0 - 46.0 % 23.8  29.0  25.2   Platelets 150 - 400 K/uL 226         Latest Ref Rng & Units 09/07/2024    4:09 AM 09/05/2024    2:48 AM 09/03/2024    5:55 PM  CMP  Glucose 70 - 99 mg/dL 91  98    BUN 8 - 23 mg/dL 7  9    Creatinine 9.55 - 1.00 mg/dL 9.43  9.45    Sodium 864 - 145 mmol/L 140  138    Potassium 3.5 - 5.1 mmol/L 3.4  3.5    Chloride 98 - 111 mmol/L 110  108    CO2 22 - 32 mmol/L 26  22    Calcium 8.9 - 10.3 mg/dL 8.2  8.4    Total Protein 6.5 - 8.1 g/dL   5.4   Total Bilirubin 0.0 - 1.2 mg/dL   0.4   Alkaline Phos 38 - 126 U/L   46   AST 15 - 41 U/L   21   ALT 0 - 44 U/L   16      Imaging studies:  No new imaging studies    Assessment/Plan:  73 y.o. female with anemia most likely secondary to cecal mass, concerning for malignancy with pathology pending   - We will tentatively plan for partial colectomy tomorrow (10/15) with Dr Marinda pending OR/Anesthesia availability  - All risks, benefits, and  alternatives to above procedure(s) were discussed with the patient, all of her questions were answered to her expressed satisfaction, patient expresses she wishes to proceed, and informed consent was obtained.   - She can do CLD today; NPO at midnight  - She will need bowel prep with PO Abx (neomycin/Flagyl) and Mag citrate; ordered   - Will transfuse 2 units pRBC today; monitor H&H  - Will get CT Chest for staging; added CEA  - Monitor abdominal examination; on-going bowel function   - Pain control prn; antiemetics prn  - Okay to mobilize today  - Further management per primary service; we will follow   All of the above findings and recommendations were discussed with the patient, and all of patient's questions were answered to her expressed satisfaction.  Thank you for the opportunity to participate in this patient's care.   -- Arthea Platt, PA-C [facility] 09/07/2024, 10:01 AM M-F: 7am - 4pm

## 2024-09-07 NOTE — Plan of Care (Signed)

## 2024-09-07 NOTE — TOC CM/SW Note (Signed)
 Transition of Care [facility]) - Inpatient Brief Assessment   Patient Details  Name: Monique [name] MRN: [id number] Date of Birth: Oct 21, 1951  Transition of Care [facility]) CM/SW Contact:    [name], RN Phone Number: 09/07/2024, 2:38 PM   Clinical Narrative:   Transition of Care (TOC) Screening Note   Patient Details  Name: Monique [name] Date of Birth: 10/28/1951   Transition of Care [facility]) CM/SW Contact:    [name], RN Phone Number: 09/07/2024, 2:38 PM    Transition of Care Department [facility]) has reviewed patient and no TOC needs have been identified at this time.  If new patient transition needs arise, please place a TOC consult.    Transition of Care Asessment: Insurance and Status: Insurance coverage has been reviewed Patient has primary care physician: Yes     Prior/Current Home Services: No current home services Social Drivers of Health Review: SDOH reviewed no interventions necessary Readmission risk has been reviewed: Yes Transition of care needs: no transition of care needs at this time

## 2024-09-07 NOTE — Progress Notes (Signed)
 Progress Note   Patient: Monique [name] FMW:[id number] DOB: [birth year] DOA: 09/03/2024     1 DOS: the patient was seen and examined on 09/07/2024   Brief hospital course: Monique [name] is a 73 y.o. year old female with past medical history of iron deficiency anemia, DVT, restless leg syndrome, and MDD presenting to the ED with rectal bleeding.  Patient had a DVT that was diagnosed in June 2025 and was started on Eliquis .  She had difficulty affording Eliquis  and was getting samples.  Recently this was switched over to dabigatran.  This was on 10/01.  Patient followed with oncology and they recommended continuing low-dose Eliquis  indefinitely.  Patient has refused hypercoagulable workup. On arrival to the ED CBC showed hemoglobin at 5.6.  She required blood transfusion and subsequently had been seen by gastroenterologist. Other hospital course as noted below  Assessment and Plan:  ABLA 2/2 Lower GI Bleed secondary to cecal mass Hx IDA Patient presented with hematochezia GI bleeding resolved Colonoscopy done by gastroenterologist on 09/06/2024 showed cecal mass General Surgery has been consulted Hematology is consulted Status post 2 units of blood transfusion General surgery planning partial colectomy tomorrow We will keep n.p.o. after midnight Receiving additional units of blood transfusion today to optimize her hemoglobin before surgery Follow-up CEA and CT chest for staging   Depressive disorder Continue escitalopram    Insomnia Continue melatonin    History of DVT  Anticoagulation on hold given GI bleed as well as planned surgery Final decision on anticoagulation at discharge to be determined by hematologist Plan of care discussed with Dr. Babara     Restless leg syndrome  Continue ropinirole      VTE prophylaxis:  SCDs    Code Status:  Full Code       Family Communication: Updated at bedside    Consults called: GI, general surgery, hematology     Subjective:   She did have some bleeding this morning Patient denies nausea vomiting abdominal pain chest pain General Surgery planning surgery tomorrow   Physical Exam:   Gen: NAD, pleasant elderly female  HENT: Dry MM, pale conjunctiva  CV: RRR, diminished pedal pusles Resp: CTAB Abd: No TTP, normal bowel sounds MSK: no asymmetry Skin: no rashes or bruising noted on skin Neuro: alert and oriented x4 Psych: normal mood    Data Reviewed:  Vitals:   09/07/24 0440 09/07/24 0843 09/07/24 1050 09/07/24 1110  BP: (!) 117/55 130/64 (!) 151/76 (!) 153/78  Pulse: 73 83 81 94  Resp: 16 16 18 18   Temp: 98 F (36.7 C) 98.4 F (36.9 C) 98.6 F (37 C) 98.6 F (37 C)  TempSrc: Oral Oral Oral Oral  SpO2: 97% 99% 100% 100%  Weight:      Height:          Latest Ref Rng & Units 09/07/2024    4:09 AM 09/06/2024    8:19 AM 09/06/2024    3:20 AM  CBC  WBC 4.0 - 10.5 K/uL 3.8     Hemoglobin 12.0 - 15.0 g/dL 7.8  9.2  7.9   Hematocrit 36.0 - 46.0 % 23.8  29.0  25.2   Platelets 150 - 400 K/uL 226          Latest Ref Rng & Units 09/07/2024    4:09 AM 09/05/2024    2:48 AM 09/03/2024   12:28 PM  BMP  Glucose 70 - 99 mg/dL 91  98  872   BUN 8 - 23 mg/dL 7  9  19   Creatinine 0.44 - 1.00 mg/dL 9.43  9.45  9.29   Sodium 135 - 145 mmol/L 140  138  139   Potassium 3.5 - 5.1 mmol/L 3.4  3.5  3.5   Chloride 98 - 111 mmol/L 110  108  103   CO2 22 - 32 mmol/L 26  22  22    Calcium 8.9 - 10.3 mg/dL 8.2  8.4  8.4      Author: Drue ONEIDA Potter, MD 09/07/2024 1:53 PM  For on call review [email or web address].

## 2024-09-08 ENCOUNTER — Inpatient Hospital Stay: Admitting: Certified Registered"

## 2024-09-08 ENCOUNTER — Encounter
Admission: EM | Disposition: A | Discharge status: Home Health | Payer: Self-pay | Source: Home / Self Care | Attending: Internal Medicine

## 2024-09-08 ENCOUNTER — Ambulatory Visit: Payer: Self-pay

## 2024-09-08 DIAGNOSIS — D62 Acute posthemorrhagic anemia: Secondary | ICD-10-CM | POA: Insufficient documentation

## 2024-09-08 HISTORY — PX: PARTIAL COLECTOMY: SHX5273

## 2024-09-08 LAB — CBC
HCT: 34.4 % — ABNORMAL LOW (ref 36.0–46.0)
Hemoglobin: 11.2 g/dL — ABNORMAL LOW (ref 12.0–15.0)
MCH: 27.9 pg (ref 26.0–34.0)
MCHC: 32.6 g/dL (ref 30.0–36.0)
MCV: 85.8 fL (ref 80.0–100.0)
Platelets: 206 K/uL (ref 150–400)
RBC: 4.01 MIL/uL (ref 3.87–5.11)
RDW: 14.3 % (ref 11.5–15.5)
WBC: 3.6 K/uL — ABNORMAL LOW (ref 4.0–10.5)
nRBC: 0 % (ref 0.0–0.2)

## 2024-09-08 LAB — TYPE AND SCREEN
ABO/RH(D): O POS
Antibody Screen: NEGATIVE
Unit division: 0
Unit division: 0

## 2024-09-08 LAB — BASIC METABOLIC PANEL WITH GFR
Anion gap: 9 (ref 5–15)
BUN: <5 mg/dL — ABNORMAL LOW (ref 8–23)
CO2: 22 mmol/L (ref 22–32)
Calcium: 8.2 mg/dL — ABNORMAL LOW (ref 8.9–10.3)
Chloride: 110 mmol/L (ref 98–111)
Creatinine, Ser: 0.58 mg/dL (ref 0.44–1.00)
GFR, Estimated: >60 mL/min (ref >60)
Glucose, Bld: 87 mg/dL (ref 70–99)
Potassium: 4.1 mmol/L (ref 3.5–5.1)
Sodium: 141 mmol/L (ref 135–145)

## 2024-09-08 LAB — BPAM RBC
Blood Product Expiration Date: 202511102359
Blood Product Expiration Date: 202511172359
ISSUE DATE / TIME: 202510141046
ISSUE DATE / TIME: 202510141410
Unit Type and Rh: 5100
Unit Type and Rh: 5100

## 2024-09-08 LAB — IRON AND TIBC
Iron: 44 ug/dL (ref 28–170)
Saturation Ratios: 11 % (ref 10.4–31.8)
TIBC: 391 ug/dL (ref 250–450)
UIBC: 347 ug/dL

## 2024-09-08 LAB — VITAMIN B12: Vitamin B-12: 416 pg/mL (ref 180–914)

## 2024-09-08 LAB — CEA: CEA: 5.8 ng/mL — ABNORMAL HIGH (ref 0.0–4.7)

## 2024-09-08 LAB — MAGNESIUM: Magnesium: 2.5 mg/dL — ABNORMAL HIGH (ref 1.7–2.4)

## 2024-09-08 LAB — FERRITIN: Ferritin: 14 ng/mL (ref 11–307)

## 2024-09-08 SURGERY — COLECTOMY, PARTIAL
Anesthesia: Choice | Laterality: Right

## 2024-09-08 SURGERY — COLECTOMY, PARTIAL
Anesthesia: General

## 2024-09-08 MED ORDER — FENTANYL CITRATE (PF) 100 MCG/2ML IJ SOLN
INTRAMUSCULAR | Status: AC
  Filled 2024-09-08: qty 2

## 2024-09-08 MED ORDER — DROPERIDOL 2.5 MG/ML IJ SOLN: 0.6250 mg | Freq: Once | INTRAMUSCULAR | Status: DC | PRN

## 2024-09-08 MED ORDER — CHLORHEXIDINE GLUCONATE 0.12 % MT SOLN
15.0000 mL | Freq: Once | OROMUCOSAL | Status: AC
  Administered 2024-09-08: 15 mL via OROMUCOSAL

## 2024-09-08 MED ORDER — GLYCOPYRROLATE 0.2 MG/ML IJ SOLN
INTRAMUSCULAR | Status: DC | PRN
  Administered 2024-09-08: .2 mg via INTRAVENOUS

## 2024-09-08 MED ORDER — ACETAMINOPHEN 10 MG/ML IV SOLN
INTRAVENOUS | Status: DC | PRN
  Administered 2024-09-08: 1000 mg via INTRAVENOUS

## 2024-09-08 MED ORDER — LIDOCAINE HCL (CARDIAC) PF 100 MG/5ML IV SOSY
PREFILLED_SYRINGE | INTRAVENOUS | Status: DC | PRN
  Administered 2024-09-08: 60 mg via INTRAVENOUS

## 2024-09-08 MED ORDER — CHLORHEXIDINE GLUCONATE CLOTH 2 % EX PADS
6.0000 | MEDICATED_PAD | Freq: Every day | CUTANEOUS | Status: DC
  Administered 2024-09-09: 6 via TOPICAL

## 2024-09-08 MED ORDER — ONDANSETRON HCL 4 MG/2ML IJ SOLN
INTRAMUSCULAR | Status: DC | PRN
  Administered 2024-09-08: 4 mg via INTRAVENOUS

## 2024-09-08 MED ORDER — OXYCODONE HCL 5 MG PO TABS
10.0000 mg | ORAL_TABLET | ORAL | Status: DC | PRN
  Administered 2024-09-09 – 2024-09-12 (×3): 10 mg via ORAL
  Filled 2024-09-08 (×3): qty 2

## 2024-09-08 MED ORDER — HYDROMORPHONE HCL 1 MG/ML IJ SOLN
INTRAMUSCULAR | Status: AC
  Filled 2024-09-08: qty 1

## 2024-09-08 MED ORDER — ACETAMINOPHEN 10 MG/ML IV SOLN: 1000.0000 mg | Freq: Once | INTRAVENOUS | Status: DC | PRN

## 2024-09-08 MED ORDER — SUGAMMADEX SODIUM 200 MG/2ML IV SOLN
INTRAVENOUS | Status: DC | PRN
  Administered 2024-09-08: 200 mg via INTRAVENOUS

## 2024-09-08 MED ORDER — OXYCODONE HCL 5 MG PO TABS
5.0000 mg | ORAL_TABLET | ORAL | Status: DC | PRN
  Administered 2024-09-08 – 2024-09-12 (×5): 5 mg via ORAL
  Filled 2024-09-08 (×5): qty 1

## 2024-09-08 MED ORDER — FENTANYL CITRATE (PF) 100 MCG/2ML IJ SOLN
INTRAMUSCULAR | Status: DC | PRN
  Administered 2024-09-08 (×2): 50 ug via INTRAVENOUS

## 2024-09-08 MED ORDER — 0.9 % SODIUM CHLORIDE (POUR BTL) OPTIME
TOPICAL | Status: DC | PRN
Start: 2024-09-08 — End: 2024-09-08
  Administered 2024-09-08: 1000 mL

## 2024-09-08 MED ORDER — DEXAMETHASONE SOD PHOSPHATE PF 10 MG/ML IJ SOLN
INTRAMUSCULAR | Status: DC | PRN
  Administered 2024-09-08: 5 mg via INTRAVENOUS

## 2024-09-08 MED ORDER — SODIUM CHLORIDE (PF) 0.9 % IJ SOLN
INTRAMUSCULAR | Status: DC | PRN
  Administered 2024-09-08: 40 mL

## 2024-09-08 MED ORDER — SODIUM CHLORIDE 0.9 % IV SOLN
1.0000 g | Freq: Once | INTRAVENOUS | Status: AC
  Administered 2024-09-08: 1 g via INTRAVENOUS
  Filled 2024-09-08: qty 1

## 2024-09-08 MED ORDER — SODIUM CHLORIDE (PF) 0.9 % IJ SOLN
INTRAMUSCULAR | Status: AC
  Filled 2024-09-08: qty 20

## 2024-09-08 MED ORDER — BUPIVACAINE-EPINEPHRINE (PF) 0.5% -1:200000 IJ SOLN
INTRAMUSCULAR | Status: AC
  Filled 2024-09-08: qty 10

## 2024-09-08 MED ORDER — BUPIVACAINE LIPOSOME 1.3 % IJ SUSP
INTRAMUSCULAR | Status: AC
  Filled 2024-09-08: qty 20

## 2024-09-08 MED ORDER — PROPOFOL 10 MG/ML IV BOLUS
INTRAVENOUS | Status: DC | PRN
  Administered 2024-09-08: 90 mg via INTRAVENOUS

## 2024-09-08 MED ORDER — ROCURONIUM BROMIDE 100 MG/10ML IV SOLN
INTRAVENOUS | Status: DC | PRN
  Administered 2024-09-08: 50 mg via INTRAVENOUS
  Administered 2024-09-08: 10 mg via INTRAVENOUS

## 2024-09-08 MED ORDER — PROPOFOL 10 MG/ML IV BOLUS
INTRAVENOUS | Status: AC
  Filled 2024-09-08: qty 20

## 2024-09-08 MED ORDER — ONDANSETRON HCL 4 MG/2ML IJ SOLN
INTRAMUSCULAR | Status: AC
  Filled 2024-09-08: qty 2

## 2024-09-08 MED ORDER — OXYCODONE HCL 5 MG/5ML PO SOLN: 5.0000 mg | Freq: Once | ORAL | Status: DC | PRN

## 2024-09-08 MED ORDER — MIDAZOLAM HCL 2 MG/2ML IJ SOLN
INTRAMUSCULAR | Status: AC
  Filled 2024-09-08: qty 2

## 2024-09-08 MED ORDER — LACTATED RINGERS IV SOLN: INTRAVENOUS | Status: DC | PRN

## 2024-09-08 MED ORDER — LACTATED RINGERS IV SOLN
INTRAVENOUS | Status: AC
Start: 2024-09-08 — End: 2024-09-10

## 2024-09-08 MED ORDER — CHLORHEXIDINE GLUCONATE 0.12 % MT SOLN
OROMUCOSAL | Status: AC
Start: 2024-09-08 — End: 2024-09-08
  Filled 2024-09-08: qty 15

## 2024-09-08 MED ORDER — FENTANYL CITRATE (PF) 100 MCG/2ML IJ SOLN: 25.0000 ug | INTRAMUSCULAR | Status: DC | PRN

## 2024-09-08 MED ORDER — GLYCOPYRROLATE 0.2 MG/ML IJ SOLN
INTRAMUSCULAR | Status: AC
  Filled 2024-09-08: qty 1

## 2024-09-08 MED ORDER — BUPIVACAINE-EPINEPHRINE (PF) 0.5% -1:200000 IJ SOLN
INTRAMUSCULAR | Status: AC
Start: 2024-09-08 — End: 2024-09-08
  Filled 2024-09-08: qty 10

## 2024-09-08 MED ORDER — ACETAMINOPHEN 10 MG/ML IV SOLN
INTRAVENOUS | Status: AC
  Filled 2024-09-08: qty 100

## 2024-09-08 MED ORDER — MIDAZOLAM HCL 2 MG/2ML IJ SOLN
INTRAMUSCULAR | Status: DC | PRN
  Administered 2024-09-08: 2 mg via INTRAVENOUS

## 2024-09-08 MED ORDER — HYDROMORPHONE HCL 1 MG/ML IJ SOLN
INTRAMUSCULAR | Status: DC | PRN
  Administered 2024-09-08: .5 mg via INTRAVENOUS

## 2024-09-08 MED ORDER — PHENYLEPHRINE HCL-NACL 20-0.9 MG/250ML-% IV SOLN
INTRAVENOUS | Status: DC | PRN
  Administered 2024-09-08: 30 ug/min via INTRAVENOUS

## 2024-09-08 MED ORDER — MORPHINE SULFATE (PF) 2 MG/ML IV SOLN: 2.0000 mg | INTRAVENOUS | Status: DC | PRN

## 2024-09-08 MED ORDER — ONDANSETRON HCL 4 MG/2ML IJ SOLN
4.0000 mg | INTRAMUSCULAR | Status: DC | PRN
  Administered 2024-09-10: 4 mg via INTRAVENOUS
  Filled 2024-09-08: qty 2

## 2024-09-08 MED ORDER — OXYCODONE HCL 5 MG PO TABS: 5.0000 mg | ORAL_TABLET | Freq: Once | ORAL | Status: DC | PRN

## 2024-09-08 SURGICAL SUPPLY — 38 items
CHLORAPREP W/TINT 26 (MISCELLANEOUS) IMPLANT
DRAPE INCISE IOBAN 66X45 STRL (DRAPES) IMPLANT
DRAPE LAPAROTOMY 100X77 ABD (DRAPES) ×1 IMPLANT
DRAPE WARM FLUID 44X44 (DRAPES) ×1 IMPLANT
DRSG OPSITE POSTOP 4X10 (GAUZE/BANDAGES/DRESSINGS) IMPLANT
DRSG OPSITE POSTOP 4X8 (GAUZE/BANDAGES/DRESSINGS) IMPLANT
ELECT BLADE 6.5 EXT (BLADE) ×1 IMPLANT
ELECTRODE REM PT RTRN 9FT ADLT (ELECTROSURGICAL) ×1 IMPLANT
GLOVE BIOGEL PI IND STRL 7.5 (GLOVE) ×1 IMPLANT
GLOVE SURG SYN 7.0 PF PI (GLOVE) ×1 IMPLANT
GOWN STRL REUS W/ TWL LRG LVL3 (GOWN DISPOSABLE) ×2 IMPLANT
HOLDER FOLEY CATH W/STRAP (MISCELLANEOUS) IMPLANT
KIT TURNOVER KIT A (KITS) ×1 IMPLANT
LABEL OR SOLS (LABEL) ×1 IMPLANT
LIGASURE IMPACT 36 18CM CVD LR (INSTRUMENTS) IMPLANT
MANIFOLD NEPTUNE II (INSTRUMENTS) ×1 IMPLANT
NDL HYPO 22X1.5 SAFETY MO (MISCELLANEOUS) ×1 IMPLANT
NEEDLE HYPO 22X1.5 SAFETY MO (MISCELLANEOUS) ×1 IMPLANT
PACK BASIN MAJOR ARMC (MISCELLANEOUS) ×1 IMPLANT
PACK COLON CLEAN CLOSURE (MISCELLANEOUS) ×1 IMPLANT
RELOAD STAPLE 75 3.8 BLU REG (ENDOMECHANICALS) IMPLANT
RETRACTOR WND ALEXIS-O 25 LRG (MISCELLANEOUS) IMPLANT
RETRACTOR WOUND ALXS 18CM MED (MISCELLANEOUS) IMPLANT
SOLN 0.9% NACL 1000 ML (IV SOLUTION) ×1 IMPLANT
SOLN 0.9% NACL POUR BTL 1000ML (IV SOLUTION) ×1 IMPLANT
SPONGE T-LAP 18X18 ~~LOC~~+RFID (SPONGE) IMPLANT
STAPLER GUN LINEAR PROX 60 (STAPLE) IMPLANT
STAPLER PROXIMATE 75MM BLUE (STAPLE) IMPLANT
STAPLER SKIN PROX 35W (STAPLE) ×1 IMPLANT
SUT PDS AB 0 CT1 27 (SUTURE) IMPLANT
SUT SILK 2-0 18XBRD TIE 12 (SUTURE) IMPLANT
SUT SILK 3 0 SH CR/8 (SUTURE) IMPLANT
SUT VIC AB 3-0 SH 27X BRD (SUTURE) ×1 IMPLANT
SYR 20ML LL LF (SYRINGE) ×2 IMPLANT
SYR BULB IRRIG 60ML STRL (SYRINGE) IMPLANT
TRAP FLUID SMOKE EVACUATOR (MISCELLANEOUS) ×1 IMPLANT
TRAY FOLEY MTR SLVR 16FR STAT (SET/KITS/TRAYS/PACK) ×1 IMPLANT
WATER STERILE IRR 500ML POUR (IV SOLUTION) ×1 IMPLANT

## 2024-09-08 NOTE — Anesthesia Preprocedure Evaluation (Signed)
 Anesthesia Evaluation  Patient identified by MRN, date of birth, ID band Patient awake    Reviewed: Allergy & Precautions, H&P , NPO status , Patient's Chart, lab work & pertinent test results, reviewed documented beta blocker date and time   Airway Mallampati: III  TM Distance: >3 FB Neck ROM: full    Dental  (+) Teeth Intact   Pulmonary pneumonia, resolved, former smoker   Pulmonary exam normal        Cardiovascular Exercise Tolerance: Poor negative cardio ROS Normal cardiovascular exam Rhythm:regular Rate:Normal     Neuro/Psych  Headaches PSYCHIATRIC DISORDERS         GI/Hepatic negative GI ROS, Neg liver ROS,,,  Endo/Other  negative endocrine ROS    Renal/GU negative Renal ROS  negative genitourinary   Musculoskeletal   Abdominal   Peds  Hematology  (+) Blood dyscrasia, anemia   Anesthesia Other Findings Past Medical History: No date: Bilateral pneumonia No date: Chronic headaches 05/20/2018: Headache disorder 04/13/2018: Hyperacusis of both ears Past Surgical History: No date: BREAST LUMPECTOMY     Comment:  Rt breast 09/06/2024: COLONOSCOPY; N/A     Comment:  Procedure: COLONOSCOPY;  Surgeon: Jinny Carmine, MD;                Location: [facility] ENDOSCOPY;  Service: Endoscopy;                Laterality: N/A; 09/06/2024: ESOPHAGOGASTRODUODENOSCOPY; N/A     Comment:  Procedure: EGD (ESOPHAGOGASTRODUODENOSCOPY);  Surgeon:               Jinny Carmine, MD;  Location: [facility] ENDOSCOPY;  Service:               Endoscopy;  Laterality: N/A; BMI    Body Mass Index: 19.14 kg/m     Reproductive/Obstetrics negative OB ROS                              Anesthesia Physical Anesthesia Plan  ASA: 2 and emergent  Anesthesia Plan: General ETT   Post-op Pain Management:    Induction:   PONV Risk Score and Plan: 4 or greater  Airway Management Planned:   Additional Equipment:   Intra-op  Plan:   Post-operative Plan:   Informed Consent: I have reviewed the patients History and Physical, chart, labs and discussed the procedure including the risks, benefits and alternatives for the proposed anesthesia with the patient or authorized representative who has indicated his/her understanding and acceptance.     Dental Advisory Given  Plan Discussed with: CRNA  Anesthesia Plan Comments:         Anesthesia Quick Evaluation

## 2024-09-08 NOTE — Hospital Course (Addendum)
 Monique [name] is a 73 y.o. year old female with past medical history of iron deficiency anemia, DVT, restless leg syndrome, and MDD presenting to the ED with rectal bleeding.  Patient had a DVT that was diagnosed in June 2025 and was started on Eliquis .  She had difficulty affording Eliquis  and was getting samples.  Recently this was switched over to dabigatran.  This was on 10/01.  Patient followed with oncology and they recommended continuing low-dose Eliquis  indefinitely.  Patient has refused hypercoagulable workup. On arrival to the ED CBC showed hemoglobin at 5.6.  She required blood transfusion and subsequently had been seen by gastroenterologist. Colonoscopy was performed on 10/13, showed cecal mass. Patient was taken to the OR for partial colectomy 10/15.  Bowel function finally improved, patient had multiple bowel movements, tolerating diet.  She is medically stable for discharge.

## 2024-09-08 NOTE — Progress Notes (Signed)
 Biopsy consistent with adenocarcinoma. Responded to 2 units pRBCs yesterday. Proceed with right colectomy today. All r/b/a discussed with patient and she understands this.

## 2024-09-08 NOTE — Op Note (Signed)
 Operative Note  Preoperative diagnosis: Cecal adenocarcinoma  postoperative diagnosis: Cecal adenocarcinoma Surgeon: Jayson Hobby, MD EBL: 50 cc Procedure: Open right hemicolectomy with primary ileocolonic anastomosis  Indication: This is a 73 year old that came in with anemia and had a colonoscopy that showed a colon mass.  This was biopsied and came back proven invasive adenocarcinoma.  Prior to her colonoscopy she did have a prep but it was noted on the colonoscopy report that this was a poor prep.  She then had an additional 2 bottles of magnesium citrate to help with bowel prep.  She also received preoperative Flagyl and neomycin.  Prior to induction she also received Invanz.  After informed consent was obtained the patient was brought to the operating room and placed supine on the operating room table.  General endotracheal anesthesia was then induced and a Foley catheter was placed.  Her abdomen was then prepped and draped in the usual sterile fashion.  A surgical timeout was called identifying correct patient, site, side and procedure.  A midline incision was made and carried through the subcutaneous tissue with Bovie cautery.  The fascia was identified and opened with Bovie cautery.  The peritoneum was grasped and opened with Metzenbaum scissors.  There is noted to be no adhesions to the anterior abdominal wall.  The incision was extended cephalad and caudad.  Upon survey of the abdomen there is noted to be no evidence of metastatic disease.  The liver was examined and there were no liver nodules or areas of concern.  I did palpate the right colon and the cecum and confirmed that there was a mass located here.  The right colon was then mobilized from lateral to medial.  I incised along the white line of Toldt carrying this around towards the hepatic flexure.  The retroperitoneum was swept off of the mesocolon and identified the duodenum to ensure that this was not medialized.  Once I came around  the hepatic flexure.  The gastrocolic ligament was then opened to get into the lesser sac and mobilized the transverse colon.  A window was then made in the colon mesentery.  The transverse colon just proximal to the middle colic vessels was then transected with a 75 mm GIA stapler.  I then turned my attention to the distal small bowel.  Some of the distal small bowel was adhered down into the right lower abdominal wall.  This was freed up with a combination of sharp and blunt dissection.  I was able to identify the terminal ileum.  Approximately 10 cm proximal to the ileocecal valve a window was made in the small bowel mesentery.  The small bowel was then transected with a 75 mm GIA blue load stapler.  The mesentery of the small bowel and colon were then taken with a LigaSure device.  When I came across the ileocolic vessels I performed a high ligation of this to ensure that we had adequate lymph node sampling for her cancer.  Once the mesentery was fully taken with LigaSure the specimen was then completely freed up and passed off the table and sent to pathology.  The abdomen was then irrigated with warm saline solution.  The ileum and transverse colon were aligned and a crotch stitch of 2-0 silk was made to align the bowels side-by-side.  The transverse colon staple line was cleared off of any fibrofatty tissue.  The staple lines were then opened with curved Mayo scissors and a 75 mm GIA stapler was used to create  a common channel.  The common channel was then closed with a firing of a TA blue load stapler.  The omentum was covered and tacked down to the staple line.  The wound was then irrigated with warm saline solution and hemostasis was obtained.  At this time we transitioned to the closing table.  We changed our gown and gloves and redraped the patient.  The fascia was closed with 0 PDS running from inferior and superior and meaning in the middle.  The skin was then closed with staples and dressed with a  sterile dressing.  The patient tolerated the procedure well.  She was then awoken from general endotracheal anesthesia and transferred to the PACU in good condition.  Prior to termination of the procedure all sponge and instrument counts were on both the operative tray and the closing table were correct x 2.

## 2024-09-08 NOTE — Anesthesia Postprocedure Evaluation (Signed)
 Anesthesia Post Note  Patient: Monique [name]  Procedure(s) Performed: COLECTOMY, PARTIAL  Patient location during evaluation: PACU Anesthesia Type: General Level of consciousness: awake and alert Pain management: pain level controlled Vital Signs Assessment: post-procedure vital signs reviewed and stable Respiratory status: spontaneous breathing, nonlabored ventilation, respiratory function stable and patient connected to nasal cannula oxygen Cardiovascular status: blood pressure returned to baseline and stable Postop Assessment: no apparent nausea or vomiting Anesthetic complications: no   No notable events documented.   Last Vitals:  Vitals:   09/08/24 1530 09/08/24 1555  BP: (!) 104/50 (!) 110/51  Pulse: 80 77  Resp: 14 16  Temp: 36.8 C 36.8 C  SpO2: 100% 98%    Last Pain:  Vitals:   09/08/24 1555  TempSrc: Oral  PainSc:                  Monique [name]

## 2024-09-08 NOTE — Progress Notes (Signed)
 Mobility Specialist Progress Note:    09/08/24 0820  Mobility  Activity Ambulated with assistance  Level of Assistance Standby assist, set-up cues, supervision of patient - no hands on  Assistive Device None  Distance Ambulated (ft) 500 ft  Range of Motion/Exercises Active;All extremities  Activity Response Tolerated well  Mobility visit 1 Mobility  Mobility Specialist Start Time (ACUTE ONLY) [id number]  Mobility Specialist Stop Time (ACUTE ONLY) 0817  Mobility Specialist Time Calculation (min) (ACUTE ONLY) 19 min   Pt received in bed, agreeable to mobility. Required supervision to stand and ambulate with no AD. Tolerated well, asx throughout. Returned to room, all needs met.  Sherrilee Ditty Mobility Specialist Please contact via Special educational needs teacher or  Rehab office at [phone]

## 2024-09-08 NOTE — Transfer of Care (Signed)
 Immediate Anesthesia Transfer of Care Note  Patient: Monique [name]  Procedure(s) Performed: COLECTOMY, PARTIAL  Patient Location: PACU  Anesthesia Type:General  Level of Consciousness: awake and alert   Airway & Oxygen Therapy: Patient Spontanous Breathing and Patient connected to nasal cannula oxygen  Post-op Assessment: Report given to RN and Post -op Vital signs reviewed and stable  Post vital signs: Reviewed and stable  Last Vitals:  Vitals Value Taken Time  BP 146/53 09/08/24 14:05  Temp    Pulse 85 09/08/24 14:07  Resp 13 09/08/24 14:07  SpO2 100 % 09/08/24 14:07  Vitals shown include unfiled device data.  Last Pain:  Vitals:   09/08/24 1007  TempSrc: Temporal  PainSc: 0-No pain      Patients Stated Pain Goal: 0 (09/05/24 2045)  Complications: No notable events documented.

## 2024-09-08 NOTE — Progress Notes (Signed)
  Progress Note   Patient: Monique [name] FMW:[id number] DOB: 12-03-1950 DOA: 09/03/2024     2 DOS: the patient was seen and examined on 09/08/2024   Brief hospital course: Monique [name] is a 73 y.o. year old female with past medical history of iron deficiency anemia, DVT, restless leg syndrome, and MDD presenting to the ED with rectal bleeding.  Patient had a DVT that was diagnosed in June 2025 and was started on Eliquis .  She had difficulty affording Eliquis  and was getting samples.  Recently this was switched over to dabigatran.  This was on 10/01.  Patient followed with oncology and they recommended continuing low-dose Eliquis  indefinitely.  Patient has refused hypercoagulable workup. On arrival to the ED CBC showed hemoglobin at 5.6.  She required blood transfusion and subsequently had been seen by gastroenterologist. Colonoscopy was performed on 10/13, showed cecal mass. Patient was taken to the OR for partial colectomy 10/15.   Principal Problem:   Acute GI bleeding Active Problems:   GI bleed   Neoplasm of digestive system   Assessment and Plan: Acute blood loss anemia secondary to lower GI bleed. 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 Patient received 2 units of PRBC, hemoglobin has improved.  Patient does not seem to have significant iron B12 deficiency. Patient is status post partial colectomy, already started liquid diet.  Concern for p.o. intake, will start gentle rehydration. Monitor electrolytes.  Major depression. Continue home medicines.  Hypokalemia. Potassium has normalized.  Lower extremity DVT. Eliquis  on hold due to GI bleed.  Fortunately, patient has completed 4 months of anticoagulation.  No urgency to start now.  Prefer to restarted once cleared by general surgery      Subjective:  Patient is very drowsy today, otherwise no complaint.  Physical Exam: Vitals:   09/08/24 1430 09/08/24 1451 09/08/24 1530 09/08/24 1555   BP: (!) 121/55 112/60 (!) 104/50 (!) 110/51  Pulse: 87 80 80 77  Resp: 11 (!) 7 14 16   Temp:  98.3 F (36.8 C) 98.2 F (36.8 C) 98.2 F (36.8 C)  TempSrc:   Oral Oral  SpO2: 100% 96% 100% 98%  Weight:      Height:       General exam: Appears calm and comfortable, pale Respiratory system: Clear to auscultation. Respiratory effort normal. Cardiovascular system: S1 & S2 heard, RRR. No JVD, murmurs, rubs, gallops or clicks. No pedal edema. Gastrointestinal system: Abdomen is nondistended, soft and nontender. No organomegaly or masses felt. Normal bowel sounds heard. Central nervous system: Alert and oriented. No focal neurological deficits. Extremities: Symmetric 5 x 5 power. Skin: No rashes, lesions or ulcers Psychiatry: Judgement and insight appear normal. Mood & affect appropriate.    Data Reviewed:  Lab results reviewed.  Family Communication: None  Disposition: Status is: Inpatient Remains inpatient appropriate because: Severity of disease, inpatient procedure, IV infusion     Time spent: 35 minutes  Author: Murvin Mana, MD 09/08/2024 4:28 PM  For on call review [email or web address].

## 2024-09-08 NOTE — Anesthesia Procedure Notes (Signed)
 Procedure Name: Intubation Date/Time: 09/08/2024 11:38 AM  Performed by: Niki Manus SAUNDERS, CRNAPre-anesthesia Checklist: Patient identified, Patient being monitored, Timeout performed, Emergency Drugs available and Suction available Patient Re-evaluated:Patient Re-evaluated prior to induction Oxygen Delivery Method: Circle system utilized Preoxygenation: Pre-oxygenation with 100% oxygen Induction Type: IV induction Ventilation: Mask ventilation without difficulty Laryngoscope Size: Glidescope and 4 Grade View: Grade I Tube type: Oral Tube size: 7.0 mm Number of attempts: 1 Airway Equipment and Method: Stylet Placement Confirmation: ETT inserted through vocal cords under direct vision, positive ETCO2 and breath sounds checked- equal and bilateral Secured at: 21 cm Tube secured with: Tape Dental Injury: Teeth and Oropharynx as per pre-operative assessment

## 2024-09-09 ENCOUNTER — Encounter: Payer: Self-pay | Admitting: General Surgery

## 2024-09-09 LAB — CBC
HCT: 32.6 % — ABNORMAL LOW (ref 36.0–46.0)
Hemoglobin: 10.1 g/dL — ABNORMAL LOW (ref 12.0–15.0)
MCH: 27.7 pg (ref 26.0–34.0)
MCHC: 31 g/dL (ref 30.0–36.0)
MCV: 89.6 fL (ref 80.0–100.0)
Platelets: 215 K/uL (ref 150–400)
RBC: 3.64 MIL/uL — ABNORMAL LOW (ref 3.87–5.11)
RDW: 14 % (ref 11.5–15.5)
WBC: 7.5 K/uL (ref 4.0–10.5)
nRBC: 0 % (ref 0.0–0.2)

## 2024-09-09 LAB — BASIC METABOLIC PANEL WITH GFR
Anion gap: 8 (ref 5–15)
BUN: 11 mg/dL (ref 8–23)
CO2: 26 mmol/L (ref 22–32)
Calcium: 8.1 mg/dL — ABNORMAL LOW (ref 8.9–10.3)
Chloride: 109 mmol/L (ref 98–111)
Creatinine, Ser: 0.86 mg/dL (ref 0.44–1.00)
GFR, Estimated: >60 mL/min (ref >60)
Glucose, Bld: 116 mg/dL — ABNORMAL HIGH (ref 70–99)
Potassium: 4.2 mmol/L (ref 3.5–5.1)
Sodium: 143 mmol/L (ref 135–145)

## 2024-09-09 LAB — MAGNESIUM: Magnesium: 2.3 mg/dL (ref 1.7–2.4)

## 2024-09-09 MED ORDER — ENOXAPARIN SODIUM 40 MG/0.4ML IJ SOSY
40.0000 mg | PREFILLED_SYRINGE | INTRAMUSCULAR | Status: DC
  Administered 2024-09-09 – 2024-09-13 (×5): 40 mg via SUBCUTANEOUS
  Filled 2024-09-09 (×6): qty 0.4

## 2024-09-09 NOTE — Progress Notes (Signed)
 Mobility Specialist Progress Note:    09/09/24 1039  Mobility  Activity Ambulated with assistance  Level of Assistance Standby assist, set-up cues, supervision of patient - no hands on  Assistive Device Front wheel walker  Distance Ambulated (ft) 75 ft  Range of Motion/Exercises Active;All extremities  Activity Response Tolerated well  Mobility visit 1 Mobility  Mobility Specialist Start Time (ACUTE ONLY) 1018  Mobility Specialist Stop Time (ACUTE ONLY) 1035  Mobility Specialist Time Calculation (min) (ACUTE ONLY) 17 min   Pt received in bed, eager for mobility. Required MinA to get EOB. Required CGA to ambulate with no AD and SBA to ambulate with RW. Tolerated well, c/o soreness in abdomen. Returned to room, left pt supine. Alarm on and belongings in reach. All needs met.  Sherrilee Ditty Mobility Specialist Please contact via Special educational needs teacher or  Rehab office at [phone]

## 2024-09-09 NOTE — Care Management Important Message (Signed)
 Important Message  Patient Details  Name: Monique [name] MRN: [id number] Date of Birth: Jan 05, 1951   Important Message Given:  Yes - Medicare IM     Rojelio SHAUNNA Rattler 09/09/2024, 11:55 AM

## 2024-09-09 NOTE — Progress Notes (Addendum)
 [facility] SURGICAL PROGRESS NOTE  Hospital Day(s): 3.   Post op day(s): 1 Day Post-Op.   Interval History:  Patient seen and examined No acute events or new complaints overnight.  Patient reports she is sore expectedly No fever, chills, nausea, emesis  She is without leukocytosis; WBC 7.5K Hgb to 10.1; relatively stable Renal function normal; sCr - 0.86; UO - 130 ccs + unmeasured No electrolyte derangements  No flatus  CLD; no issues   Vital signs in last 24 hours: [min-max] current  Temp:  [98 F (36.7 C)-98.5 F (36.9 C)] 98 F (36.7 C) (10/16 0412) Pulse Rate:  [74-87] 80 (10/16 0412) Resp:  [7-17] 16 (10/16 0412) BP: (104-157)/(50-78) 121/66 (10/16 0412) SpO2:  [96 %-100 %] 98 % (10/16 0412) Weight:  [52.2 kg] 52.2 kg (10/15 1007)     Height: 5' 5 (165.1 cm) Weight: 52.2 kg BMI (Calculated): 19.14   Intake/Output last 2 shifts:  10/15 0701 - 10/16 0700 In: 1275.7 [I.V.:1175.7; IV Piggyback:100] Out: 130 [Urine:130]   Physical Exam:  Constitutional: alert, cooperative and no distress  Respiratory: breathing non-labored at rest  Cardiovascular: regular rate and sinus rhythm  Gastrointestinal: soft, incisional soreness, and non-distended, no rebound/guarding.  Genitourinary: Foley in place; good UO  Integumentary: Laparotomy is CDI with staples, honeycomb in place, no drainage   Labs:     Latest Ref Rng & Units 09/09/2024    4:29 AM 09/08/2024    6:36 AM 09/07/2024    4:09 AM  CBC  WBC 4.0 - 10.5 K/uL 7.5  3.6  3.8   Hemoglobin 12.0 - 15.0 g/dL 89.8  88.7  7.8   Hematocrit 36.0 - 46.0 % 32.6  34.4  23.8   Platelets 150 - 400 K/uL 215  206  226       Latest Ref Rng & Units 09/09/2024    4:29 AM 09/08/2024    6:36 AM 09/07/2024    4:09 AM  CMP  Glucose 70 - 99 mg/dL 883  87  91   BUN 8 - 23 mg/dL 11  <5  7   Creatinine 0.44 - 1.00 mg/dL 9.13  9.41  9.43   Sodium 135 - 145 mmol/L 143  141  140   Potassium 3.5 - 5.1 mmol/L 4.2  4.1   3.4   Chloride 98 - 111 mmol/L 109  110  110   CO2 22 - 32 mmol/L 26  22  26    Calcium 8.9 - 10.3 mg/dL 8.1  8.2  8.2      Imaging studies: No new pertinent imaging studies   Assessment/Plan:  73 y.o. 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  -- Arthea Platt, PA-C [facility] 09/09/2024, 7:19 AM M-F: 7am - 4pm

## 2024-09-09 NOTE — Plan of Care (Signed)

## 2024-09-09 NOTE — Progress Notes (Signed)
  Progress Note   Patient: Monique [name] FMW:[id number] DOB: [birth year] DOA: 09/03/2024     3 DOS: the patient was seen and examined on 09/09/2024   Brief hospital course: Monique [name] is a 73 y.o. year old female with past medical history of iron deficiency anemia, DVT, restless leg syndrome, and MDD presenting to the ED with rectal bleeding.  Patient had a DVT that was diagnosed in June 2025 and was started on Eliquis .  She had difficulty affording Eliquis  and was getting samples.  Recently this was switched over to dabigatran.  This was on 10/01.  Patient followed with oncology and they recommended continuing low-dose Eliquis  indefinitely.  Patient has refused hypercoagulable workup. On arrival to the ED CBC showed hemoglobin at 5.6.  She required blood transfusion and subsequently had been seen by gastroenterologist. Colonoscopy was performed on 10/13, showed cecal mass. Patient was taken to the OR for partial colectomy 10/15.   Principal Problem:   Acute GI bleeding Active Problems:   GI bleed   Neoplasm of digestive system   Acute blood loss anemia   Assessment and Plan:  Acute blood loss anemia secondary to lower GI bleed. 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 Patient received 2 units of PRBC, hemoglobin has improved.  Patient does not seem to have significant iron B12 deficiency. Patient is status post partial colectomy, liquid diet. Continue symptomatic treatment, continue lower dose IV fluids.   Major depression. Continue home medicines.   Hypokalemia. Potassium has normalized.   Lower extremity DVT. Eliquis  on hold due to GI bleed.  Fortunately, patient has completed 4 months of anticoagulation.  No urgency to start now.  Prefer to restarted once cleared by general surgery.       Subjective:  Patient doing well today, no nausea vomiting.  No significant pain.  Still has not had a bowel movement  Physical Exam: Vitals:    09/08/24 1555 09/08/24 1700 09/08/24 2011 09/09/24 0412  BP: (!) 110/51 123/66 114/62 121/66  Pulse: 77 84 80 80  Resp: 16 16 16 16   Temp: 98.2 F (36.8 C) 98.2 F (36.8 C) 98 F (36.7 C) 98 F (36.7 C)  TempSrc: Oral Oral Oral   SpO2: 98% 100% 97% 98%  Weight:      Height:       General exam: Appears calm and comfortable  Respiratory system: Clear to auscultation. Respiratory effort normal. Cardiovascular system: S1 & S2 heard, RRR. No JVD, murmurs, rubs, gallops or clicks. No pedal edema. Gastrointestinal system: Abdomen is nondistended, soft and nontender. No organomegaly or masses felt.  Decreased bowel sounds heard. Central nervous system: Alert and oriented. No focal neurological deficits. Extremities: Symmetric 5 x 5 power. Skin: No rashes, lesions or ulcers Psychiatry: Judgement and insight appear normal. Mood & affect appropriate.    Data Reviewed:  Lab results reviewed  Family Communication: None  Disposition: Status is: Inpatient Remains inpatient appropriate because: Severity of disease, IV infusion, status post surgery.     Time spent: 35 minutes  Author: Murvin Mana, MD 09/09/2024 11:16 AM  For on call review [email or web address].

## 2024-09-10 LAB — CBC
HCT: 32.1 % — ABNORMAL LOW (ref 36.0–46.0)
Hemoglobin: 10 g/dL — ABNORMAL LOW (ref 12.0–15.0)
MCH: 27.5 pg (ref 26.0–34.0)
MCHC: 31.2 g/dL (ref 30.0–36.0)
MCV: 88.2 fL (ref 80.0–100.0)
Platelets: 201 K/uL (ref 150–400)
RBC: 3.64 MIL/uL — ABNORMAL LOW (ref 3.87–5.11)
RDW: 13.5 % (ref 11.5–15.5)
WBC: 6 K/uL (ref 4.0–10.5)
nRBC: 0 % (ref 0.0–0.2)

## 2024-09-10 LAB — BASIC METABOLIC PANEL WITH GFR
Anion gap: 11 (ref 5–15)
BUN: 9 mg/dL (ref 8–23)
CO2: 26 mmol/L (ref 22–32)
Calcium: 8.3 mg/dL — ABNORMAL LOW (ref 8.9–10.3)
Chloride: 103 mmol/L (ref 98–111)
Creatinine, Ser: 0.63 mg/dL (ref 0.44–1.00)
GFR, Estimated: >60 mL/min (ref >60)
Glucose, Bld: 105 mg/dL — ABNORMAL HIGH (ref 70–99)
Potassium: 4.2 mmol/L (ref 3.5–5.1)
Sodium: 140 mmol/L (ref 135–145)

## 2024-09-10 MED ORDER — SODIUM CHLORIDE 0.9 % IV SOLN
12.5000 mg | Freq: Four times a day (QID) | INTRAVENOUS | Status: DC | PRN
  Administered 2024-09-10 – 2024-09-11 (×2): 12.5 mg via INTRAVENOUS
  Filled 2024-09-10 (×2): qty 12.5

## 2024-09-10 MED ORDER — POLYETHYLENE GLYCOL 3350 17 G PO PACK
17.0000 g | PACK | Freq: Every day | ORAL | Status: DC
  Administered 2024-09-10 – 2024-09-13 (×4): 17 g via ORAL
  Filled 2024-09-10 (×5): qty 1

## 2024-09-10 MED ORDER — METHOCARBAMOL 1000 MG/10ML IJ SOLN
500.0000 mg | Freq: Three times a day (TID) | INTRAMUSCULAR | Status: DC
Start: 2024-09-10 — End: 2024-09-13
  Administered 2024-09-10 – 2024-09-13 (×9): 500 mg via INTRAVENOUS
  Filled 2024-09-10 (×13): qty 5

## 2024-09-10 NOTE — Progress Notes (Signed)
 Mobility Specialist - Progress Note   09/10/24 1154  Mobility  Activity Ambulated with assistance  Level of Assistance Standby assist, set-up cues, supervision of patient - no hands on  Assistive Device Front wheel walker  Distance Ambulated (ft) 32 ft  Activity Response Tolerated well  Mobility visit 1 Mobility  Mobility Specialist Start Time (ACUTE ONLY) 1142  Mobility Specialist Stop Time (ACUTE ONLY) 1153  Mobility Specialist Time Calculation (min) (ACUTE ONLY) 11 min   Pt amb to/from the bathroom w/ sup and left seated in the recliner with alarm set and needs within reach. Pt expressed neck pain once seated in the recliner, RN notified.  America Silvan Mobility Specialist 09/10/24 11:58 AM

## 2024-09-10 NOTE — Progress Notes (Signed)
 Monique Monique [name]  Hospital Day(s): 4.   Post op day(s): 2 Days Post-Op.   Interval History:  Patient seen and examined No acute events or new complaints overnight.  Patient reports she is doing okay Abdomen is sore Distension improving No fever, chills, nausea, emesis  She is without leukocytosis; WBC 6.0K Hgb to 10.0; stable Renal function normal; sCr - 0.63; UO - 560 ccs + unmeasured No electrolyte derangements  No flatus  CLD; no issues   Vital signs in last 24 hours: [min-max] current  Temp:  [98 F (36.7 C)-99.4 F (37.4 C)] 98.Monique F (37.2 C) (10/17 0739) Pulse Rate:  [87-95] 95 (10/17 0739) Resp:  [16-18] 16 (10/17 0739) BP: (146-153)/(71-76) 153/71 (10/17 0739) SpO2:  [92 %-95 %] 93 % (10/17 0739)     Height: 5' 5 (165.1 cm) Weight: 52.2 kg BMI (Calculated): 19.14   Intake/Output last 2 shifts:  10/16 0701 - 10/17 0700 In: -  Out: 560 [Urine:560]   Physical Exam:  Constitutional: alert, cooperative and no distress  Respiratory: breathing non-labored at rest  Cardiovascular: regular rate and sinus rhythm  Gastrointestinal: soft, incisional soreness, and non-distended, no rebound/guarding.  Integumentary: Laparotomy is CDI with staples, honeycomb in place, no drainage   Labs:     Latest Ref Rng & Units 09/10/2024    5:34 AM 09/09/2024    4:29 AM 09/08/2024    6:36 AM  CBC  WBC 4.0 - 10.5 K/uL 6.0  7.5  3.6   Hemoglobin 12.0 - 15.0 g/dL 89.Monique  89.8  88.7   Hematocrit 36.0 - 46.0 % 32.1  32.6  34.4   Platelets 150 - 400 K/uL 201  215  206       Latest Ref Rng & Units 09/10/2024    5:34 AM 09/09/2024    4:29 AM 09/08/2024    6:36 AM  CMP  Glucose 70 - 99 mg/dL 894  883  87   BUN 8 - 23 mg/dL Monique  11  <5   Creatinine 0.44 - 1.00 mg/dL Monique.36  Monique.13  Monique.41   Sodium 135 - 145 mmol/L 140  143  141   Potassium 3.5 - 5.1 mmol/L 4.2  4.2  4.1   Chloride 98 - 111 mmol/L 103  109  110   CO2 22 - 32 mmol/L 26  26  22    Calcium  8.Monique - 10.3 mg/dL 8.3  8.1  8.2      Imaging studies: No new pertinent imaging studies   Assessment/Plan:  73 y.o. 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  -- Arthea Platt, PA-C [facility] 09/10/2024, Monique:44 AM M-F: 7am - 4pm

## 2024-09-10 NOTE — Plan of Care (Signed)

## 2024-09-10 NOTE — Evaluation (Signed)
 Physical Therapy Evaluation Patient Details Name: Monique [name] MRN: [id number] DOB: July 13, 1951 Today's Date: 09/10/2024  History of Present Illness  73 y/o female presented to ED on 09/03/24 for bloody stool. 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 S/p open R hemicolectomy with primary ileocolonic anastomosis on 10/15. PMH: iron deficiency anemia, DVT, restless leg syndrome, MDD  Clinical Impression  Patient seen following surgery on 10/15. PTA, patient lives alone and was independent. Currently patient requiring RW for light support following surgery whereas prior to surgery patient was ambulatory with no AD. Anticipate this slight decrease in function is due to pain level. Educated patient on log roll technique for bed mobility to reduce pressure on abdomen, patient demonstrated good follow through. Anticipate patient will progress quickly once pain is managed. Patient will benefit from skilled PT services during acute stay to address listed deficits. Patient will benefit from ongoing therapy at discharge to maximize functional independence and safety.         If plan is discharge home, recommend the following: A little help with walking and/or transfers   Can travel by private vehicle        Equipment Recommendations Rolling Monique [name] (2 wheels)  Recommendations for Other Services       Functional Status Assessment Patient has had a recent decline in their functional status and demonstrates the ability to make significant improvements in function in a reasonable and predictable amount of time.     Precautions / Restrictions Precautions Precautions: Fall Recall of Precautions/Restrictions: Intact Restrictions Weight Bearing Restrictions Per Provider Order: No      Mobility  Bed Mobility Overal bed mobility: Needs Assistance Bed Mobility: Rolling, Sidelying to Sit, Sit to Sidelying Rolling: Supervision Sidelying to sit: Supervision     Sit to sidelying:  Supervision General bed mobility comments: cues for log roll technique to take pressure off of abdomen    Transfers Overall transfer level: Needs assistance Equipment used: Rolling Monique [name] (2 wheels) Transfers: Sit to/from Stand Sit to Stand: Supervision                Ambulation/Gait Ambulation/Gait assistance: Supervision Gait Distance (Feet): 200 Feet Assistive device: Rolling Monique [name] (2 wheels) Gait Pattern/deviations: Step-through pattern, Decreased stride length Gait velocity: decreased     General Gait Details: supervision for safety. No physical assist needed.  Stairs            Wheelchair Mobility     Tilt Bed    Modified Rankin (Stroke Patients Only)       Balance Overall balance assessment: Mild deficits observed, not formally tested                                           Pertinent Vitals/Pain Pain Assessment Pain Assessment: Faces Faces Pain Scale: Hurts even more Pain Location: abdomen Pain Descriptors / Indicators: Discomfort, Grimacing Pain Intervention(s): Limited activity within patient's tolerance, Monitored during session, Repositioned    Home Living Family/patient expects to be discharged to:: Private residence Living Arrangements: Alone Available Help at Discharge: Other (Comment) (no real help available) Type of Home: House Home Access: Stairs to enter Entrance Stairs-Rails: None Entrance Stairs-Number of Steps: 1   Home Layout: One level Home Equipment: Shower seat;Cane - single point      Prior Function Prior Level of Function : Independent/Modified Independent  Mobility Comments: independent, no device ADLs Comments: independent     Extremity/Trunk Assessment   Upper Extremity Assessment Upper Extremity Assessment: Overall WFL for tasks assessed    Lower Extremity Assessment Lower Extremity Assessment: Overall WFL for tasks assessed       Communication    Communication Communication: No apparent difficulties    Cognition Arousal: Alert Behavior During Therapy: WFL for tasks assessed/performed   PT - Cognitive impairments: No apparent impairments                         Following commands: Intact       Cueing       General Comments      Exercises     Assessment/Plan    PT Assessment Patient needs continued PT services  PT Problem List Decreased strength;Decreased balance;Decreased activity tolerance       PT Treatment Interventions DME instruction;Gait training;Functional mobility training;Therapeutic activities;Therapeutic exercise;Balance training;Patient/family education    PT Goals (Current goals can be found in the Care Plan section)  Acute Rehab PT Goals Patient Stated Goal: to go home PT Goal Formulation: With patient Time For Goal Achievement: 09/24/24 Potential to Achieve Goals: Good    Frequency Min 1X/week     Co-evaluation               AM-PAC PT 6 Clicks Mobility  Outcome Measure Help needed turning from your back to your side while in a flat bed without using bedrails?: A Little Help needed moving from lying on your back to sitting on the side of a flat bed without using bedrails?: A Little Help needed moving to and from a bed to a chair (including a wheelchair)?: A Little Help needed standing up from a chair using your arms (e.g., wheelchair or bedside chair)?: A Little Help needed to walk in hospital room?: A Little Help needed climbing 3-5 steps with a railing? : A Little 6 Click Score: 18    End of Session   Activity Tolerance: Patient tolerated treatment well Patient left: in bed;with call bell/phone within reach;with bed alarm set Nurse Communication: Mobility status PT Visit Diagnosis: Unsteadiness on feet (R26.81)    Time: 9140-9085 PT Time Calculation (min) (ACUTE ONLY): 15 min   Charges:   PT Evaluation $PT Eval Low Complexity: 1 Low   PT General Charges $$  ACUTE PT VISIT: 1 Visit         Monique [name], PT, DPT Physical Therapist - [facility]  [facility]   Monique [name] 09/10/2024, 9:29 AM

## 2024-09-10 NOTE — TOC Progression Note (Signed)
 Transition of Care [facility]) - Progression Note    Patient Details  Name: Monique [name] MRN: [id number] Date of Birth: 1951-05-29  [facility] CM/SW Contact  [name], RN Phone Number: 09/10/2024, 3:35 PM  Clinical Narrative:          Met with patient at bedside.  Discussed recs for home health.  Patient states she does not have a preference of agency.  Accepted [facility] in the HUB.  Notified Kelly with [facility] . Referral made to [facility] for RW Patient states at discharge she is not sure who will be available to pick her up, she states it depends on the day and time               Expected Discharge Plan and Services                                               Social Drivers of Health (SDOH) Interventions SDOH Screenings   Food Insecurity: No Food Insecurity (09/04/2024)  Housing: Low Risk  (09/04/2024)  Transportation Needs: No Transportation Needs (09/04/2024)  Utilities: Not At Risk (09/04/2024)  Depression (PHQ2-9): Low Risk  (08/09/2024)  Recent Concern: Depression (PHQ2-9) - High Risk (07/05/2024)  Social Connections: Unknown (09/04/2024)  Tobacco Use: Medium Risk (09/06/2024)    Readmission Risk Interventions     No data to display

## 2024-09-10 NOTE — Progress Notes (Signed)
  Progress Note   Patient: Monique [name] FMW:[id number] DOB: [birth year] DOA: 09/03/2024     4 DOS: the patient was seen and examined on 09/10/2024   Brief hospital course: Monique [name] is a 73 y.o. year old female with past medical history of iron deficiency anemia, DVT, restless leg syndrome, and MDD presenting to the ED with rectal bleeding.  Patient had a DVT that was diagnosed in June 2025 and was started on Eliquis .  She had difficulty affording Eliquis  and was getting samples.  Recently this was switched over to dabigatran.  This was on 10/01.  Patient followed with oncology and they recommended continuing low-dose Eliquis  indefinitely.  Patient has refused hypercoagulable workup. On arrival to the ED CBC showed hemoglobin at 5.6.  She required blood transfusion and subsequently had been seen by gastroenterologist. Colonoscopy was performed on 10/13, showed cecal mass. Patient was taken to the OR for partial colectomy 10/15.   Principal Problem:   Acute GI bleeding Active Problems:   GI bleed   Neoplasm of digestive system   Acute blood loss anemia   Assessment and Plan: Acute blood loss anemia secondary to lower GI bleed. 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 Patient received 2 units of PRBC, hemoglobin has improved.  Patient does not seem to have significant iron B12 deficiency. Patient is status post partial colectomy, liquid diet. Has been tolerating liquid diet well, no nausea vomiting.  However patient still has not had a bowel movement.  Continue to follow.   Major depression. Continue home medicines.   Hypokalemia. Potassium has normalized.   Lower extremity DVT. Eliquis  on hold due to GI bleed.  Fortunately, patient has completed 4 months of anticoagulation.  No urgency to start now.  Prefer to restarted once cleared by general surgery.          Subjective:  Patient doing well today, no abdominal pain or nausea vomiting.  But  has not had a bowel movement.  She tolerated diet well  Physical Exam: Vitals:   09/09/24 1435 09/09/24 2000 09/10/24 0334 09/10/24 0739  BP: (!) 149/74 (!) 146/76 (!) 149/75 (!) 153/71  Pulse: 87 92 94 95  Resp: 18 16 16 16   Temp: 98.3 F (36.8 C) 99.4 F (37.4 C) 98 F (36.7 C) 98.9 F (37.2 C)  TempSrc: Oral Oral Oral   SpO2: 95% 92% 93% 93%  Weight:      Height:       General exam: Appears calm and comfortable  Respiratory system: Clear to auscultation. Respiratory effort normal. Cardiovascular system: S1 & S2 heard, RRR. No JVD, murmurs, rubs, gallops or clicks. No pedal edema. Gastrointestinal system: Abdomen is nondistended, soft and nontender. No organomegaly or masses felt. Normal bowel sounds heard. Central nervous system: Alert and oriented. No focal neurological deficits. Extremities: Symmetric 5 x 5 power. Skin: No rashes, lesions or ulcers Psychiatry: Judgement and insight appear normal. Mood & affect appropriate.    Data Reviewed:  Lab results reviewed with  Family Communication: None  Disposition: Status is: Inpatient Remains inpatient appropriate because: Severity of disease, postop.     Time spent: 35 minutes  Author: Murvin Mana, MD 09/10/2024 12:40 PM  For on call review [email or web address].

## 2024-09-11 MED ORDER — LACTATED RINGERS IV SOLN: INTRAVENOUS | Status: DC

## 2024-09-11 MED ORDER — LACTULOSE 10 GM/15ML PO SOLN: 20.0000 g | Freq: Once | ORAL | Status: DC

## 2024-09-11 NOTE — Progress Notes (Signed)
 [facility] SURGICAL PROGRESS NOTE  Hospital Day(s): 5.   Post op day(s): 3 Days Post-Op.   Interval History:  Patient seen and examined No acute events or new complaints overnight.  Patient reports she is doing okay Abdomen is sore Distension improving No fever, chills, nausea, emesis  She was without leukocytosis;  Hgb to 10.0; stable Renal function normal; sCr - 0.63;  No electrolyte derangements  No flatus, still. CLD; no issues   Vital signs in last 24 hours: [min-max] current  Temp:  [97.7 F (36.5 C)-98.8 F (37.1 C)] 98.8 F (37.1 C) (10/18 1613) Pulse Rate:  [103-109] 109 (10/18 1613) Resp:  [16-20] 20 (10/18 1613) BP: (126-176)/(75-94) 126/75 (10/18 1613) SpO2:  [92 %-94 %] 92 % (10/18 1613)     Height: 5' 5 (165.1 cm) Weight: 52.2 kg BMI (Calculated): 19.14   Intake/Output last 2 shifts:  10/17 0701 - 10/18 0700 In: 200 [P.O.:200] Out: -    Physical Exam:  Constitutional: alert, cooperative and no distress  Respiratory: breathing non-labored at rest  Cardiovascular: Well-perfused Gastrointestinal: soft, and non-distended, no rebound/guarding.  Integumentary: Laparotomy is CDI with staples, honeycomb in place, no drainage   Labs:     Latest Ref Rng & Units 09/10/2024    5:34 AM 09/09/2024    4:29 AM 09/08/2024    6:36 AM  CBC  WBC 4.0 - 10.5 K/uL 6.0  7.5  3.6   Hemoglobin 12.0 - 15.0 g/dL 89.9  89.8  88.7   Hematocrit 36.0 - 46.0 % 32.1  32.6  34.4   Platelets 150 - 400 K/uL 201  215  206       Latest Ref Rng & Units 09/10/2024    5:34 AM 09/09/2024    4:29 AM 09/08/2024    6:36 AM  CMP  Glucose 70 - 99 mg/dL 894  883  87   BUN 8 - 23 mg/dL 9  11  <5   Creatinine 0.44 - 1.00 mg/dL 9.36  9.13  9.41   Sodium 135 - 145 mmol/L 140  143  141   Potassium 3.5 - 5.1 mmol/L 4.2  4.2  4.1   Chloride 98 - 111 mmol/L 103  109  110   CO2 22 - 32 mmol/L 26  26  22    Calcium 8.9 - 10.3 mg/dL 8.3  8.1  8.2      Imaging studies: No  new pertinent imaging studies   Assessment/Plan:  73 y.o. 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  Honor Leghorn, M.D., [facility]  09/11/2024 ; 5:34 PM

## 2024-09-11 NOTE — Progress Notes (Signed)
  Progress Note   Patient: Monique [name] FMW:[id number] DOB: [birth year] DOA: 09/03/2024     5 DOS: the patient was seen and examined on 09/11/2024   Brief hospital course: Monique [name] is a 73 y.o. year old female with past medical history of iron deficiency anemia, DVT, restless leg syndrome, and MDD presenting to the ED with rectal bleeding.  Patient had a DVT that was diagnosed in June 2025 and was started on Eliquis .  She had difficulty affording Eliquis  and was getting samples.  Recently this was switched over to dabigatran.  This was on 10/01.  Patient followed with oncology and they recommended continuing low-dose Eliquis  indefinitely.  Patient has refused hypercoagulable workup. On arrival to the ED CBC showed hemoglobin at 5.6.  She required blood transfusion and subsequently had been seen by gastroenterologist. Colonoscopy was performed on 10/13, showed cecal mass. Patient was taken to the OR for partial colectomy 10/15.   Principal Problem:   Acute GI bleeding Active Problems:   GI bleed   Neoplasm of digestive system   Acute blood loss anemia   Assessment and Plan: Acute blood loss anemia secondary to lower GI bleed. 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 Patient received 2 units of PRBC, hemoglobin has improved.  Patient does not seem to have significant iron B12 deficiency. Patient is status post partial colectomy, liquid diet. Patient still has a fair appetite, occasionally nauseated.  I will continue IV fluids.  Started MiraLAX.   Major depression. Continue home medicines.   Hypokalemia. Potassium has normalized.   Lower extremity DVT. Eliquis  on hold due to GI bleed.  Fortunately, patient has completed 4 months of anticoagulation.  No urgency to start now.  Consider restart at time of discharge       Subjective:  Patient has a poor appetite, with occasional nausea without vomiting.  Physical Exam: Vitals:   09/10/24 0739  09/10/24 1634 09/10/24 1940 09/11/24 0334  BP: (!) 153/71 (!) 162/73 (!) 176/94 (!) 164/91  Pulse: 95 96 (!) 103 (!) 105  Resp: 16 16 16 16   Temp: 98.9 F (37.2 C) 98.4 F (36.9 C) 98.3 F (36.8 C) 97.7 F (36.5 C)  TempSrc:   Oral Oral  SpO2: 93% 92% 93% 94%  Weight:      Height:       General exam: Appears calm and comfortable  Respiratory system: Clear to auscultation. Respiratory effort normal. Cardiovascular system: S1 & S2 heard, RRR. No JVD, murmurs, rubs, gallops or clicks. No pedal edema. Gastrointestinal system: Abdomen is nondistended, soft and nontender. No organomegaly or masses felt. Normal bowel sounds heard. Central nervous system: Alert and oriented. No focal neurological deficits. Extremities: Symmetric 5 x 5 power. Skin: No rashes, lesions or ulcers Psychiatry: Judgement and insight appear normal. Mood & affect appropriate.    Data Reviewed:  Lab results reviewed  Family Communication: None  Disposition: Status is: Inpatient Remains inpatient appropriate because: Severity of disease, IV treatment     Time spent: 35 minutes  Author: Murvin Mana, MD 09/11/2024 11:51 AM  For on call review [email or web address].

## 2024-09-11 NOTE — Plan of Care (Signed)

## 2024-09-12 DIAGNOSIS — E871 Hypo-osmolality and hyponatremia: Secondary | ICD-10-CM | POA: Insufficient documentation

## 2024-09-12 LAB — PHOSPHORUS: Phosphorus: 3.7 mg/dL (ref 2.5–4.6)

## 2024-09-12 LAB — BASIC METABOLIC PANEL WITH GFR
Anion gap: 10 (ref 5–15)
BUN: 11 mg/dL (ref 8–23)
CO2: 25 mmol/L (ref 22–32)
Calcium: 8 mg/dL — ABNORMAL LOW (ref 8.9–10.3)
Chloride: 96 mmol/L — ABNORMAL LOW (ref 98–111)
Creatinine, Ser: 0.64 mg/dL (ref 0.44–1.00)
GFR, Estimated: >60 mL/min (ref >60)
Glucose, Bld: 97 mg/dL (ref 70–99)
Potassium: 3.7 mmol/L (ref 3.5–5.1)
Sodium: 131 mmol/L — ABNORMAL LOW (ref 135–145)

## 2024-09-12 LAB — CBC
HCT: 30.8 % — ABNORMAL LOW (ref 36.0–46.0)
Hemoglobin: 9.9 g/dL — ABNORMAL LOW (ref 12.0–15.0)
MCH: 27.5 pg (ref 26.0–34.0)
MCHC: 32.1 g/dL (ref 30.0–36.0)
MCV: 85.6 fL (ref 80.0–100.0)
Platelets: 294 K/uL (ref 150–400)
RBC: 3.6 MIL/uL — ABNORMAL LOW (ref 3.87–5.11)
RDW: 13.1 % (ref 11.5–15.5)
WBC: 9.7 K/uL (ref 4.0–10.5)
nRBC: 0 % (ref 0.0–0.2)

## 2024-09-12 LAB — MAGNESIUM: Magnesium: 1.7 mg/dL (ref 1.7–2.4)

## 2024-09-12 MED ORDER — ENSURE PLUS HIGH PROTEIN PO LIQD
237.0000 mL | Freq: Two times a day (BID) | ORAL | Status: DC
Start: 2024-09-12 — End: 2024-09-13

## 2024-09-12 MED ORDER — SODIUM CHLORIDE 0.9 % IV SOLN: INTRAVENOUS | Status: DC

## 2024-09-12 NOTE — Progress Notes (Signed)
  Progress Note   Patient: Monique [name] FMW:[id number] DOB: 27-Feb-1951 DOA: 09/03/2024     6 DOS: the patient was seen and examined on 09/12/2024   Brief hospital course: Monique [name] is a 73 y.o. year old female with past medical history of iron deficiency anemia, DVT, restless leg syndrome, and MDD presenting to the ED with rectal bleeding.  Patient had a DVT that was diagnosed in June 2025 and was started on Eliquis .  She had difficulty affording Eliquis  and was getting samples.  Recently this was switched over to dabigatran.  This was on 10/01.  Patient followed with oncology and they recommended continuing low-dose Eliquis  indefinitely.  Patient has refused hypercoagulable workup. On arrival to the ED CBC showed hemoglobin at 5.6.  She required blood transfusion and subsequently had been seen by gastroenterologist. Colonoscopy was performed on 10/13, showed cecal mass. Patient was taken to the OR for partial colectomy 10/15.   Principal Problem:   Acute GI bleeding Active Problems:   GI bleed   Neoplasm of digestive system   Acute blood loss anemia   Hyponatremia   Assessment and Plan:  Acute blood loss anemia secondary to lower GI bleed. 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 Patient received 2 units of PRBC, hemoglobin has improved.  Patient does not seem to have significant iron B12 deficiency. Patient is status post partial colectomy, liquid diet. Patient had a large bowel movement on 10/18, diet advanced to full liquid diet. I will add Ensure.  Discontinue IV fluids.   Major depression. Continue home medicines.   Hypokalemia. Potassium has normalized.   Lower extremity DVT. Eliquis  on hold due to GI bleed.  Fortunately, patient has completed 4 months of anticoagulation.  No urgency to start now.  Consider restart at time of discharge      Subjective:  Patient had a large bowel movement yesterday evening, also passed gas.  No  longer has nausea vomiting.  Physical Exam: Vitals:   09/11/24 2050 09/11/24 2203 09/12/24 0430 09/12/24 0857  BP: (!) 171/89 (!) 151/77 (!) 158/91 102/62  Pulse: (!) 112 (!) 102 97 (!) 103  Resp: 20 20 18 18   Temp: 98.6 F (37 C) 98.5 F (36.9 C) 98.2 F (36.8 C) 98.2 F (36.8 C)  TempSrc:   Oral Oral  SpO2: 93% 93% 91% 92%  Weight:      Height:       General exam: Appears calm and comfortable  Respiratory system: Clear to auscultation. Respiratory effort normal. Cardiovascular system: S1 & S2 heard, RRR. No JVD, murmurs, rubs, gallops or clicks. No pedal edema. Gastrointestinal system: Abdomen is nondistended, soft and nontender. No organomegaly or masses felt. Normal bowel sounds heard. Central nervous system: Alert and oriented. No focal neurological deficits. Extremities: Symmetric 5 x 5 power. Skin: No rashes, lesions or ulcers Psychiatry: Judgement and insight appear normal. Mood & affect appropriate.    Data Reviewed:  Lab results reviewed.  Family Communication: None  Disposition: Status is: Inpatient Remains inpatient appropriate because: Severity of disease, postop.     Time spent: 35 minutes  Author: Murvin Mana, MD 09/12/2024 11:16 AM  For on call review [email or web address].

## 2024-09-12 NOTE — Progress Notes (Signed)
 [facility] SURGICAL PROGRESS NOTE  Hospital Day(s): 6.   Post op day(s): 4 Days Post-Op.   Interval History:  Patient seen and examined No acute events or new complaints overnight.   Patient reports she is doing okay, had bowel movement but denies flatus No fever, chills, nausea, emesis  She was without leukocytosis;  Renal function normal;  No electrolyte derangements  CLD; with which she is easily satiated,  Vital signs in last 24 hours: [min-max] current  Temp:  [98.2 F (36.8 C)-98.8 F (37.1 C)] 98.2 F (36.8 C) (10/19 0430) Pulse Rate:  [97-112] 97 (10/19 0430) Resp:  [18-20] 18 (10/19 0430) BP: (126-171)/(75-91) 158/91 (10/19 0430) SpO2:  [91 %-93 %] 91 % (10/19 0430)     Height: 5' 5 (165.1 cm) Weight: 52.2 kg BMI (Calculated): 19.14   Intake/Output last 2 shifts:  No intake/output data recorded.   Physical Exam:  Constitutional: alert, cooperative and no distress  Respiratory: breathing non-labored at rest  Cardiovascular: Well-perfused Gastrointestinal: soft, and non-distended, no rebound/guarding.  Integumentary: Laparotomy is CDI with staples, honeycomb in place, no drainage   Labs:     Latest Ref Rng & Units 09/12/2024    4:47 AM 09/10/2024    5:34 AM 09/09/2024    4:29 AM  CBC  WBC 4.0 - 10.5 K/uL 9.7  6.0  7.5   Hemoglobin 12.0 - 15.0 g/dL 9.9  89.9  89.8   Hematocrit 36.0 - 46.0 % 30.8  32.1  32.6   Platelets 150 - 400 K/uL 294  201  215       Latest Ref Rng & Units 09/12/2024    4:47 AM 09/10/2024    5:34 AM 09/09/2024    4:29 AM  CMP  Glucose 70 - 99 mg/dL 97  894  883   BUN 8 - 23 mg/dL 11  9  11    Creatinine 0.44 - 1.00 mg/dL 9.35  9.36  9.13   Sodium 135 - 145 mmol/L 131  140  143   Potassium 3.5 - 5.1 mmol/L 3.7  4.2  4.2   Chloride 98 - 111 mmol/L 96  103  109   CO2 22 - 32 mmol/L 25  26  26    Calcium 8.9 - 10.3 mg/dL 8.0  8.3  8.1      Imaging studies: No new pertinent imaging studies   Assessment/Plan:   73 y.o. 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  Honor Leghorn, M.D., [facility]  09/12/2024 ; 8:41 AM

## 2024-09-13 ENCOUNTER — Other Ambulatory Visit: Payer: Self-pay

## 2024-09-13 ENCOUNTER — Ambulatory Visit: Payer: Self-pay | Admitting: Oncology

## 2024-09-13 LAB — BASIC METABOLIC PANEL WITH GFR
Anion gap: 13 (ref 5–15)
BUN: 11 mg/dL (ref 8–23)
CO2: 22 mmol/L (ref 22–32)
Calcium: 7.9 mg/dL — ABNORMAL LOW (ref 8.9–10.3)
Chloride: 98 mmol/L (ref 98–111)
Creatinine, Ser: 0.49 mg/dL (ref 0.44–1.00)
GFR, Estimated: >60 mL/min (ref >60)
Glucose, Bld: 78 mg/dL (ref 70–99)
Potassium: 3.5 mmol/L (ref 3.5–5.1)
Sodium: 133 mmol/L — ABNORMAL LOW (ref 135–145)

## 2024-09-13 LAB — MAGNESIUM: Magnesium: 1.8 mg/dL (ref 1.7–2.4)

## 2024-09-13 LAB — SURGICAL PATHOLOGY

## 2024-09-13 MED ORDER — OXYCODONE HCL 5 MG PO TABS
5.0000 mg | ORAL_TABLET | Freq: Four times a day (QID) | ORAL | 0 refills | Status: DC | PRN
  Filled 2024-09-13: qty 10, 3d supply, fill #0

## 2024-09-13 MED ORDER — OXYCODONE HCL 5 MG PO TABS: 5.0000 mg | ORAL_TABLET | Freq: Four times a day (QID) | ORAL | 0 refills | Status: DC | PRN

## 2024-09-13 NOTE — Discharge Instructions (Signed)
In addition to included general post-operative instructions,  Diet: Resume home diet.   Activity: No heavy lifting >20 pounds (children, pets, laundry, garbage) or strenuous activity for 6 weeks, but light activity and walking are encouraged. Do not drive or drink alcohol if taking narcotic pain medications or having pain that might distract from driving.  Wound care: You may shower/get incision wet with soapy water and pat dry (do not rub incisions), but no baths or submerging incision underwater until follow-up. We will remove staples in follow up   Medications: Resume all home medications. For mild to moderate pain: acetaminophen (Tylenol) or ibuprofen/naproxen (if no kidney disease). Combining Tylenol with alcohol can substantially increase your risk of causing liver disease. Narcotic pain medications, if prescribed, can be used for severe pain, though may cause nausea, constipation, and drowsiness. Do not combine Tylenol and Percocet (or similar) within a 6 hour period as Percocet (and similar) contain(s) Tylenol. If you do not need the narcotic pain medication, you do not need to fill the prescription.  Call office [phone] / [phone]) at any time if any questions, worsening pain, fevers/chills, bleeding, drainage from incision site, or other concerns.

## 2024-09-13 NOTE — Discharge Summary (Signed)
 Physician Discharge Summary   Patient: Monique [name] MRN: [id number] DOB: [birth year]  Admit date:     09/03/2024  Discharge date: 09/13/24  Discharge Physician: Murvin Mana   PCP: Herold Hadassah SQUIBB, MD   Recommendations at discharge:   Follow-up with PCP in 1 week. Follow-up with general surgery as scheduled. Follow-up with oncology in 1 week.  Discharge Diagnoses: Principal Problem:   Acute GI bleeding Active Problems:   GI bleed   Neoplasm of digestive system   Acute blood loss anemia   Hyponatremia  Resolved Problems:   * No resolved hospital problems. *  Hospital Course: Monique [name] is a 73 y.o. year old female with past medical history of iron deficiency anemia, DVT, restless leg syndrome, and MDD presenting to the ED with rectal bleeding.  Patient had a DVT that was diagnosed in June 2025 and was started on Eliquis .  She had difficulty affording Eliquis  and was getting samples.  Recently this was switched over to dabigatran.  This was on 10/01.  Patient followed with oncology and they recommended continuing low-dose Eliquis  indefinitely.  Patient has refused hypercoagulable workup. On arrival to the ED CBC showed hemoglobin at 5.6.  She required blood transfusion and subsequently had been seen by gastroenterologist. Colonoscopy was performed on 10/13, showed cecal mass. Patient was taken to the OR for partial colectomy 10/15.  Bowel function finally improved, patient had multiple bowel movements, tolerating diet.  She is medically stable for discharge.  Assessment and Plan:  Acute blood loss anemia secondary to lower GI bleed. 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 Colon cancer. Patient received 2 units of PRBC, hemoglobin has improved.  Patient does not seem to have significant iron B12 deficiency. 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 Patient had recovered since surgery, had multiple bowel movement,  tolerating diet.  Medically stable for discharge.  Patient will be followed by general surgery and oncology as outpatient.    Major depression. Continue home medicines.   Hypokalemia. Potassium has normalized.   Lower extremity DVT. Eliquis  on hold due to GI bleed.  Fortunately, patient has completed 4 months of anticoagulation.  Will restart Pradaxa at time of discharge.        Consultants: General surgery and oncology Procedures performed: Partial Colectomy. Disposition: Home health Diet recommendation:  Discharge Diet Orders (From admission, onward)     Start     Ordered   09/13/24 0000  Diet - low sodium heart healthy        09/13/24 1346           Cardiac diet DISCHARGE MEDICATION: Allergies as of 09/13/2024       Reactions   Penicillins    bleeding        Medication List     STOP taking these medications    Doxepin  HCl 6 MG Tabs       TAKE these medications    dabigatran 150 MG Caps capsule Commonly known as: PRADAXA Take 1 capsule (150 mg total) by mouth 2 (two) times daily. Patient does not have insurance- please use GoodRx: BIN [id number], PCN GDC, ID [id number], Grp GDRX   escitalopram  5 MG tablet Commonly known as: Lexapro  Take 1 tablet (5 mg total) by mouth daily.   oxyCODONE 5 MG immediate release tablet Commonly known as: Oxy IR/ROXICODONE Take 1 tablet (5 mg total) by mouth every 6 (six) hours as needed for moderate pain (pain score 4-6) or severe pain (pain score  7-10).   PRENATAL/FOLIC ACID+DHA PO Take 1 tablet by mouth daily.   topiramate 50 MG tablet Commonly known as: TOPAMAX Take 1 tablet by mouth daily.               Durable Medical Equipment  (From admission, onward)           Start     Ordered   09/10/24 1501  For home use only DME Walker rolling  Once       Question Answer Comment  Walker: With 5 Inch Wheels   Patient needs a walker to treat with the following condition Weakness      09/10/24 1501               Discharge Care Instructions  (From admission, onward)           Start     Ordered   09/13/24 0000  Discharge wound care:       Comments: Follow with General surgery   09/13/24 1346            Contact information for follow-up providers     Marinda Jayson KIDD, MD. Go on 09/21/2024.   Specialty: General Surgery Why: Go to appointment on 10/28 at 200 PM Contact information: [address] [place] [address] [phone]         Herold Hadassah SQUIBB, MD Follow up in 1 week(s).   Specialty: Family Medicine Contact information: [address] [place] [address] [phone]         Babara Call, MD Follow up in 1 week(s).   Specialty: Oncology Contact information: [address] [place] [address] [phone]              Contact information for after-discharge care     Home Medical Care     [facility]) .   Service: Home Health Services Contact information: [address] [place]  [address] [phone]                    Discharge Exam: Fredricka Weights   09/03/24 1231 09/06/24 1250 09/08/24 1007  Weight: 54.4 kg 54 kg 52.2 kg   General exam: Appears calm and comfortable  Respiratory system: Clear to auscultation. Respiratory effort normal. Cardiovascular system: S1 & S2 heard, RRR. No JVD, murmurs, rubs, gallops or clicks. No pedal edema. Gastrointestinal system: Abdomen is nondistended, soft and nontender. No organomegaly or masses felt. Normal bowel sounds heard. Central nervous system: Alert and oriented. No focal neurological deficits. Extremities: Symmetric 5 x 5 power. Skin: No rashes, lesions or ulcers Psychiatry: Judgement and insight appear normal. Mood & affect appropriate.    Condition at discharge: good  The results of significant diagnostics from this hospitalization (including imaging, microbiology, ancillary and laboratory) are  listed below for reference.   Imaging Studies: CT CHEST W CONTRAST Result Date: 09/07/2024 CLINICAL DATA:  History of colon cancer. Assess treatment response. Staging. * Tracking Code: BO * EXAM: CT CHEST WITH CONTRAST TECHNIQUE: Multidetector CT imaging of the chest was performed during intravenous contrast administration. RADIATION DOSE REDUCTION: This exam was performed according to the departmental dose-optimization program which includes automated exposure control, adjustment of the mA and/or kV according to patient size and/or use of iterative reconstruction technique. CONTRAST:  75mL OMNIPAQUE IOHEXOL 300 MG/ML  SOLN COMPARISON:  Chest CT 12/04/2011.  Abdominal CTA 09/03/2024. FINDINGS: Cardiovascular: No acute vascular findings are demonstrated. There is atherosclerosis  of the aorta, great vessels and coronary arteries. The ascending aorta is mildly dilated with a diameter of 4.4 cm. No evidence of aortic dissection. No evidence of acute pulmonary embolism. The heart size is normal. There is a small pericardial effusion. Mediastinum/Nodes: There are no enlarged mediastinal, hilar or axillary lymph nodes. The thyroid gland, trachea and esophagus demonstrate no significant findings. Lungs/Pleura: No pleural effusion or pneumothorax. Mild linear scarring or atelectasis at both lung bases. There is a small focus of clustered nodularity peripherally in the right upper lobe, likely inflammatory. No confluent airspace disease or suspicious pulmonary nodularity. Upper abdomen: No acute findings are seen within the visualized upper abdomen. There is trace perihepatic and perisplenic ascites. Musculoskeletal/Chest wall: There is no chest wall mass or suspicious osseous finding. Mild convex left thoracic scoliosis with mild multilevel spondylosis. There is a hemangioma within the T6 vertebral body. IMPRESSION: 1. No evidence of metastatic disease within the chest. 2. Small focus of clustered nodularity  peripherally in the right upper lobe, likely inflammatory. 3. Small pericardial effusion. 4. Trace ascites in the upper abdomen. 5. Mildly dilated ascending aorta. Recommend annual imaging followup by CTA or MRA. This recommendation follows 2010 ACCF/AHA/AATS/ACR/ASA/SCA/SCAI/SIR/STS/SVM Guidelines for the Diagnosis and Management of Patients with Thoracic Aortic Disease. Circulation. 2010; 121: Z733-z630. Aortic aneurysm NOS (ICD10-I71.9) Aortic Atherosclerosis (ICD10-I70.0). Electronically Signed   By: Elsie Perone M.D.   On: 09/07/2024 15:16   CT ANGIO GI BLEED Result Date: 09/03/2024 CLINICAL DATA:  Acute lower GI bleeding. EXAM: CTA ABDOMEN AND PELVIS WITHOUT AND WITH CONTRAST TECHNIQUE: Multidetector CT imaging of the abdomen and pelvis was performed using the standard protocol during bolus administration of intravenous contrast. Multiplanar reconstructed images and MIPs were obtained and reviewed to evaluate the vascular anatomy. RADIATION DOSE REDUCTION: This exam was performed according to the departmental dose-optimization program which includes automated exposure control, adjustment of the mA and/or kV according to patient size and/or use of iterative reconstruction technique. CONTRAST:  OMNIPAQUE IOHEXOL 350 MG/ML SOLN COMPARISON:  CT chest 12/04/2011.  Chest radiograph 12/06/2011 FINDINGS: VASCULAR Aorta: Normal caliber aorta without aneurysm, dissection, vasculitis or significant stenosis. Diffuse aortic calcification. Celiac: Patent without evidence of aneurysm, dissection, vasculitis or significant stenosis. SMA: Patent without evidence of aneurysm, dissection, vasculitis or significant stenosis. Renals: Both renal arteries are patent without evidence of aneurysm, dissection, vasculitis, fibromuscular dysplasia or significant stenosis. IMA: Patent without evidence of aneurysm, dissection, vasculitis or significant stenosis. Inflow: Patent without evidence of aneurysm, dissection,  vasculitis or significant stenosis. Proximal Outflow: Bilateral common femoral and visualized portions of the superficial and profunda femoral arteries are patent without evidence of aneurysm, dissection, vasculitis or significant stenosis. Veins: No obvious venous abnormality within the limitations of this arterial phase study. Review of the MIP images confirms the above findings. NON-VASCULAR Lower chest: Mild dependent changes in the lung bases. Hepatobiliary: No focal liver abnormality is seen. No gallstones, gallbladder wall thickening, or biliary dilatation. Pancreas: Unremarkable. No pancreatic ductal dilatation or surrounding inflammatory changes. Spleen: Normal in size without focal abnormality. Adrenals/Urinary Tract: Adrenal glands are unremarkable. Kidneys are normal, without renal calculi, focal lesion, or hydronephrosis. Bladder is unremarkable. Stomach/Bowel: Stomach, small bowel, and colon are not abnormally distended. No wall thickening or inflammatory stranding identified. Colonic diverticulosis without evidence of acute diverticulitis. Appendix is normal. No areas of abnormal intraluminal contrast extravasation or contrast pooling or identified. No focal site of active gastrointestinal bleeding is identified. Increased density demonstrated within multiple colonic diverticula consistent with inspissated stool. Lymphatic:  No significant lymphadenopathy. Reproductive: Uterus and bilateral adnexa are unremarkable. Other: No abdominal wall hernia or abnormality. No abdominopelvic ascites. Musculoskeletal: No acute or significant osseous findings. IMPRESSION: VASCULAR Diffuse aortic atherosclerosis. No aneurysm, dissection, or critical stenosis demonstrated in the major abdominal/pelvic arterial system. NON-VASCULAR 1. No evidence of bowel obstruction or inflammation. Colonic diverticulosis without evidence of acute diverticulitis. 2. No focal area of active gastrointestinal hemorrhage is identified.  Electronically Signed   By: Elsie Gravely M.D.   On: 09/03/2024 16:02   US  Venous Img Lower Unilateral Right (DVT) Result Date: 09/02/2024 CLINICAL DATA:  History of known DVT in the right popliteal and calf veins EXAM: RIGHT LOWER EXTREMITY VENOUS DOPPLER ULTRASOUND TECHNIQUE: Gray-scale sonography with graded compression, as well as color Doppler and duplex ultrasound were performed to evaluate the lower extremity deep venous systems from the level of the common femoral vein and including the common femoral, femoral, profunda femoral, popliteal and calf veins including the posterior tibial, peroneal and gastrocnemius veins when visible. The superficial great saphenous vein was also interrogated. Spectral Doppler was utilized to evaluate flow at rest and with distal augmentation maneuvers in the common femoral, femoral and popliteal veins. COMPARISON:  Prior duplex venous ultrasound 05/12/2024 FINDINGS: Contralateral Common Femoral Vein: Respiratory phasicity is normal and symmetric with the symptomatic side. No evidence of thrombus. Normal compressibility. Common Femoral Vein: No evidence of thrombus. Normal compressibility, respiratory phasicity and response to augmentation. Saphenofemoral Junction: No evidence of thrombus. Normal compressibility and flow on color Doppler imaging. Profunda Femoral Vein: No evidence of thrombus. Normal compressibility and flow on color Doppler imaging. Femoral Vein: No evidence of thrombus. Normal compressibility, respiratory phasicity and response to augmentation. Popliteal Vein: No evidence of thrombus. Normal compressibility, respiratory phasicity and response to augmentation. Calf Veins: Segmental nonocclusive thrombus remains visible within the posterior tibial veins. Superficial Great Saphenous Vein: No evidence of thrombus. Normal compressibility. Venous Reflux:  None. Other Findings:  None. IMPRESSION: 1. No evidence of acute deep venous thrombosis. 2. Significant  interval improvement in previously identified DVT with complete resolution of thrombus from within the popliteal veins. There is a small amount of residual chronic thrombus in the posterior tibial veins within the calf. Electronically Signed   By: Wilkie Lent M.D.   On: 09/02/2024 11:41    Microbiology: Results for orders placed or performed during the hospital encounter of 09/03/24  KOH prep     Status: None   Collection Time: 09/06/24  2:05 PM   Specimen: Esophagus  Result Value Ref Range Status   Specimen Description ESOPHAGUS  Final   Special Requests NONE  Final   KOH Prep   Final    NO YEAST OR FUNGAL ELEMENTS SEEN Performed at [facility], [address] [place], [place] [address]    Report Status 09/06/2024 FINAL  Final    Labs: CBC: Recent Labs  Lab 09/07/24 0409 09/08/24 0636 09/09/24 0429 09/10/24 0534 09/12/24 0447  WBC 3.8* 3.6* 7.5 6.0 9.7  NEUTROABS 2.2  --   --   --   --   HGB 7.8* 11.2* 10.1* 10.0* 9.9*  HCT 23.8* 34.4* 32.6* 32.1* 30.8*  MCV 86.9 85.8 89.6 88.2 85.6  PLT 226 206 215 201 294   Basic Metabolic Panel: Recent Labs  Lab 09/08/24 0636 09/09/24 0429 09/10/24 0534 09/12/24 0447 09/13/24 0402  NA 141 143 140 131* 133*  K 4.1 4.2 4.2 3.7 3.5  CL 110 109 103 96* 98  CO2 22 26 26  25 22  GLUCOSE 87 116* 105* 97 78  BUN <5* 11 9 11 11   CREATININE 0.58 0.86 0.63 0.64 0.49  CALCIUM 8.2* 8.1* 8.3* 8.0* 7.9*  MG 2.5* 2.3  --  1.7 1.8  PHOS  --   --   --  3.7  --    Liver Function Tests: No results for input(s): AST, ALT, ALKPHOS, BILITOT, PROT, ALBUMIN in the last 168 hours. CBG: No results for input(s): GLUCAP in the last 168 hours.  Discharge time spent: 35 minutes.  Signed: Murvin Mana, MD [facility] 09/13/2024

## 2024-09-13 NOTE — Plan of Care (Signed)

## 2024-09-13 NOTE — Plan of Care (Signed)
  Problem: Education: Goal: Knowledge of General Education information will improve Description: Including pain rating scale, medication(s)/side effects and non-pharmacologic comfort measures Outcome: Adequate for Discharge   Problem: Health Behavior/Discharge Planning: Goal: Ability to manage health-related needs will improve Outcome: Adequate for Discharge   Problem: Clinical Measurements: Goal: Ability to maintain clinical measurements within normal limits will improve Outcome: Adequate for Discharge Goal: Will remain free from infection Outcome: Adequate for Discharge Goal: Diagnostic test results will improve Outcome: Adequate for Discharge Goal: Respiratory complications will improve Outcome: Adequate for Discharge Goal: Cardiovascular complication will be avoided Outcome: Adequate for Discharge   Problem: Activity: Goal: Risk for activity intolerance will decrease Outcome: Adequate for Discharge   Problem: Nutrition: Goal: Adequate nutrition will be maintained Outcome: Adequate for Discharge   Problem: Coping: Goal: Level of anxiety will decrease Outcome: Adequate for Discharge   Problem: Elimination: Goal: Will not experience complications related to bowel motility Outcome: Adequate for Discharge Goal: Will not experience complications related to urinary retention Outcome: Adequate for Discharge   Problem: Pain Managment: Goal: General experience of comfort will improve and/or be controlled Outcome: Adequate for Discharge   Problem: Safety: Goal: Ability to remain free from injury will improve Outcome: Adequate for Discharge   Problem: Skin Integrity: Goal: Risk for impaired skin integrity will decrease Outcome: Adequate for Discharge   Problem: Acute Rehab PT Goals(only PT should resolve) Goal: Pt Will Go Supine/Side To Sit Outcome: Adequate for Discharge Goal: Patient Will Transfer Sit To/From Stand Outcome: Adequate for Discharge Goal: Pt Will  Ambulate Outcome: Adequate for Discharge

## 2024-09-13 NOTE — TOC Transition Note (Addendum)
 Transition of Care [facility]) - Discharge Note   Patient Details  Name: Monique [name] MRN: [id number] Date of Birth: 1951-08-29  Transition of [facility]) CM/SW Contact:  Monique ONEIDA Haddock, RN Phone Number: 09/13/2024, 2:23 PM   Clinical Narrative:     Patient to dc today [name]  with [facility] notified Patient not eligible for RW through insurance as she only has part A.  Private pay cost $75.  Patient in agreement.  [facility] to reach out for patient payment.    Patient confirms that she does not have anyone that can transport her today, and does not have the means to arrange for a cab.   Completed cab voucher on chart, and rider waiver signed by patient and placed in chart   Update:  patient not eligible for RW due to only having medicare part A.  Patient declines to private pay  09/13/2024  Monique [name] DOB: 1950-12-09 MRN: [id number]   RIDER WAIVER AND RELEASE OF LIABILITY  For the purposes of helping with transportation needs, [name] partners with outside transportation providers (taxi companies, [place], Catering manager.) to give [name] patients or other approved people the choice of on-demand rides [name]) to our buildings for non-emergency visits.  By using [employer], I, the person signing this document, on behalf of myself and/or any legal minors (in my care using the [employer]), agree:  Science writer given to me are supplied by independent, outside transportation providers who do not work for, or have any affiliation with, [name]. [name] is not a transportation company. [name] has no control over the quality or safety of the rides I get using [employer]. [name] has no control over whether any outside ride will happen on time or not. [name] gives no guarantee on the reliability, quality, safety, or availability on any rides, or that no mistakes will happen. I know and accept that traveling by vehicle (car,  truck, SVU, fleeta, bus, taxi, etc.) has risks of serious injuries such as disability, being paralyzed, and death. I know and agree the risk of using [employer] is mine alone, and not [name]. [employer] are provided as is and as are available. The transportation providers are in charge for all inspections and care of the vehicles used to provide these rides. I agree not to take legal action against [name], its agents, employees, officers, directors, representatives, insurers, attorneys, assigns, successors, subsidiaries, and affiliates at any time for any reasons related directly or indirectly to using [employer]. I also agree not to take legal action against [name] or its affiliates for any injury, death, or damage to property caused by or related to using [employer]. I have read this Waiver and Release of Liability, and I understand the terms used in it and their legal meaning. This Waiver is freely and voluntarily given with the understanding that my right (or any legal minors) to legal action against [place] relating to [employer] is knowingly given up to use these services.   I attest that I read the Ride Waiver and Release of Liability to Monique [name], gave Monique [name] the opportunity to ask questions and answered the questions asked (if any). I affirm that Monique [name] then provided consent for assistance with transportation.  Patient Goals and CMS Choice            Discharge Placement                       Discharge Plan and Services Additional resources added to the After Visit Summary for                                       Social Drivers of Health (SDOH) Interventions SDOH Screenings   Food Insecurity: No Food Insecurity (09/04/2024)  Housing: Low Risk  (09/04/2024)  Transportation Needs: No Transportation Needs (09/04/2024)  Utilities: Not  At Risk (09/04/2024)  Depression (PHQ2-9): Low Risk  (08/09/2024)  Recent Concern: Depression (PHQ2-9) - High Risk (07/05/2024)  Social Connections: Unknown (09/04/2024)  Tobacco Use: Medium Risk (09/06/2024)     Readmission Risk Interventions     No data to display

## 2024-09-13 NOTE — Progress Notes (Signed)
 Physical Therapy Treatment Patient Details Name: Monique [name] MRN: [id number] DOB: 1951/03/18 Today's Date: 09/13/2024   History of Present Illness 73 y/o female presented to ED on 09/03/24 for bloody stool. 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 S/p open R hemicolectomy with primary ileocolonic anastomosis on 10/15. PMH: iron deficiency anemia, DVT, restless leg syndrome, MDD    PT Comments  Pt transitions in and out of bed with ease.  She stands confidently and opts not to use RW for support.  She does walk with increased speed and left/right drift is noted.  When questioned she does endorse feeling off balance and occasionally will reach for rails for support.  She stated she did not use RW at baseline but is open to taking one home.  She is advised to use RW for outside and community ambulation but should do well inside her home where she can light touch objects as needed if she does not opt to use it.  Returns to supine with needs met.   If plan is discharge home, recommend the following: A little help with walking and/or transfers;Help with stairs or ramp for entrance   Can travel by private vehicle        Equipment Recommendations  Rolling walker (2 wheels)    Recommendations for Other Services       Precautions / Restrictions Precautions Precautions: Fall Recall of Precautions/Restrictions: Intact Restrictions Weight Bearing Restrictions Per Provider Order: No     Mobility  Bed Mobility Overal bed mobility: Modified Independent               Patient Response: Cooperative, Flat affect  Transfers Overall transfer level: Modified independent Equipment used: None Transfers: Sit to/from Stand Sit to Stand: Modified independent (Device/Increase time)                Ambulation/Gait Ambulation/Gait assistance: Supervision, Contact guard assist Gait Distance (Feet): 300 Feet Assistive device: None Gait Pattern/deviations: Step-through  pattern, Decreased stride length, Drifts right/left Gait velocity: decreased     General Gait Details: general imbalances without AD noted.   Stairs             Wheelchair Mobility     Tilt Bed Tilt Bed Patient Response: Cooperative, Flat affect  Modified Rankin (Stroke Patients Only)       Balance Overall balance assessment: Mild deficits observed, not formally tested, Needs assistance Sitting-balance support: Feet supported       Standing balance support: No upper extremity supported Standing balance-Leahy Scale: Fair                              Hotel manager: No apparent difficulties  Cognition Arousal: Alert Behavior During Therapy: WFL for tasks assessed/performed, Flat affect   PT - Cognitive impairments: Safety/Judgement                       PT - Cognition Comments: some decreased awareness noted Following commands: Intact      Cueing Cueing Techniques: Verbal cues  Exercises      General Comments        Pertinent Vitals/Pain Pain Assessment Pain Assessment: Faces Faces Pain Scale: Hurts a little bit Pain Location: abdomen Pain Descriptors / Indicators: Discomfort, Grimacing Pain Intervention(s): Limited activity within patient's tolerance, Monitored during session, Repositioned    Home Living  Prior Function            PT Goals (current goals can now be found in the care plan section) Progress towards PT goals: Progressing toward goals    Frequency    Min 1X/week      PT Plan      Co-evaluation              AM-PAC PT 6 Clicks Mobility   Outcome Measure  Help needed turning from your back to your side while in a flat bed without using bedrails?: None Help needed moving from lying on your back to sitting on the side of a flat bed without using bedrails?: None Help needed moving to and from a bed to a chair (including a  wheelchair)?: None Help needed standing up from a chair using your arms (e.g., wheelchair or bedside chair)?: None Help needed to walk in hospital room?: A Little Help needed climbing 3-5 steps with a railing? : A Little 6 Click Score: 22    End of Session   Activity Tolerance: Patient tolerated treatment well Patient left: in bed;with call bell/phone within reach;with bed alarm set Nurse Communication: Mobility status PT Visit Diagnosis: Unsteadiness on feet (R26.81)     Time: 8974-8966 PT Time Calculation (min) (ACUTE ONLY): 8 min  Charges:    $Gait Training: 8-22 mins PT General Charges $$ ACUTE PT VISIT: 1 Visit                   Lauraine Gills, PTA 09/13/24, 10:53 AM

## 2024-09-13 NOTE — Progress Notes (Signed)
 [facility] SURGICAL PROGRESS NOTE  Hospital Day(s): 7.   Post op day(s): 5 Days Post-Op.   Interval History:  Patient seen and examined No acute events or new complaints overnight.  Patient reports she is doing well; feels weak  Abdomen is sore; manageable  No fever, chills, nausea, emesis  Renal function normal; sCr - 0.49; UO - 200 ccs + unmeasured No electrolyte derangements  She is on FLD; no issues She did have recorded BM; non-bloody Mobilized this AM  Vital signs in last 24 hours: [min-max] current  Temp:  [97.8 F (36.6 C)-99.6 F (37.6 C)] 97.8 F (36.6 C) (10/20 0410) Pulse Rate:  [83-103] 83 (10/20 0410) Resp:  [16-18] 17 (10/20 0410) BP: (102-134)/(55-72) 134/61 (10/20 0410) SpO2:  [92 %-94 %] 94 % (10/20 0410)     Height: 5' 5 (165.1 cm) Weight: 52.2 kg BMI (Calculated): 19.14   Intake/Output last 2 shifts:  10/19 0701 - 10/20 0700 In: 100 [IV Piggyback:100] Out: 200 [Urine:200]   Physical Exam:  Constitutional: alert, cooperative and no distress  Respiratory: breathing non-labored at rest  Cardiovascular: regular rate and sinus rhythm  Gastrointestinal: soft, incisional soreness, and non-distended, no rebound/guarding.  Integumentary: Laparotomy is CDI with staples, honeycomb removed, no drainage, no erythema  Labs:     Latest Ref Rng & Units 09/12/2024    4:47 AM 09/10/2024    5:34 AM 09/09/2024    4:29 AM  CBC  WBC 4.0 - 10.5 K/uL 9.7  6.0  7.5   Hemoglobin 12.0 - 15.0 g/dL 9.9  89.9  89.8   Hematocrit 36.0 - 46.0 % 30.8  32.1  32.6   Platelets 150 - 400 K/uL 294  201  215       Latest Ref Rng & Units 09/13/2024    4:02 AM 09/12/2024    4:47 AM 09/10/2024    5:34 AM  CMP  Glucose 70 - 99 mg/dL 78  97  894   BUN 8 - 23 mg/dL 11  11  9    Creatinine 0.44 - 1.00 mg/dL 9.50  9.35  9.36   Sodium 135 - 145 mmol/L 133  131  140   Potassium 3.5 - 5.1 mmol/L 3.5  3.7  4.2   Chloride 98 - 111 mmol/L 98  96  103   CO2 22 - 32  mmol/L 22  25  26    Calcium 8.9 - 10.3 mg/dL 7.9  8.0  8.3      Imaging studies: No new pertinent imaging studies   Assessment/Plan:  73 y.o. female 5 Days Post-Op s/p right hemicolectomy for adenocarcinoma of the cecum    - Okay for soft diet  - Monitor abdominal examination; on-going bowel function  - Pain control prn; antiemetics prn   - Needs to mobilize; therapies on board - Continue Lovenox; 40 mg daily  - Follow up pathology when available   - Further management per primary service; we will follow along     - Discharge Planning: Doing well from surgical perspective, okay for DC home this afternoon vs tomorrow morning. We will follow up in ~1 week for staple removal. DC instructions updated.   All of the above findings and recommendations were discussed with the patient, and the medical team, and all of patient's questions were answered to her expressed satisfaction.  -- Arthea Platt, PA-C [facility] 09/13/2024, 7:29 AM M-F: 7am - 4pm

## 2024-09-13 NOTE — Progress Notes (Signed)
 Mobility Specialist Progress Note:    09/13/24 0800  Mobility  Activity Ambulated with assistance  Level of Assistance Standby assist, set-up cues, supervision of patient - no hands on  Assistive Device None  Distance Ambulated (ft) 200 ft  Range of Motion/Exercises Active;All extremities  Activity Response Tolerated well  Mobility visit 1 Mobility  Mobility Specialist Start Time (ACUTE ONLY) 0746  Mobility Specialist Stop Time (ACUTE ONLY) 0800  Mobility Specialist Time Calculation (min) (ACUTE ONLY) 14 min   Pt received in bed, agreeable to mobility. Required supervision to stand and ambulate with no AD. Tolerated well, asx throughout. Returned supine, all needs met.  Sherrilee Ditty Mobility Specialist Please contact via Special educational needs teacher or  Rehab office at [phone]

## 2024-09-15 ENCOUNTER — Ambulatory Visit: Payer: Self-pay | Admitting: Oncology

## 2024-09-16 ENCOUNTER — Inpatient Hospital Stay
Admission: EM | Admit: 2024-09-16 | Discharge: 2024-09-23 | DRG: 394 | Disposition: A | Discharge status: Home / Self Care | Payer: Self-pay | Attending: General Surgery | Admitting: General Surgery

## 2024-09-16 ENCOUNTER — Other Ambulatory Visit: Payer: Self-pay

## 2024-09-16 ENCOUNTER — Emergency Department: Payer: Self-pay

## 2024-09-16 DIAGNOSIS — K9189 Other postprocedural complications and disorders of digestive system: Principal | ICD-10-CM | POA: Diagnosis present

## 2024-09-16 DIAGNOSIS — R519 Headache, unspecified: Secondary | ICD-10-CM | POA: Diagnosis present

## 2024-09-16 DIAGNOSIS — Z825 Family history of asthma and other chronic lower respiratory diseases: Secondary | ICD-10-CM | POA: Diagnosis not present

## 2024-09-16 DIAGNOSIS — F419 Anxiety disorder, unspecified: Secondary | ICD-10-CM | POA: Diagnosis present

## 2024-09-16 DIAGNOSIS — Z801 Family history of malignant neoplasm of trachea, bronchus and lung: Secondary | ICD-10-CM

## 2024-09-16 DIAGNOSIS — Z9889 Other specified postprocedural states: Secondary | ICD-10-CM

## 2024-09-16 DIAGNOSIS — Z538 Procedure and treatment not carried out for other reasons: Secondary | ICD-10-CM | POA: Diagnosis not present

## 2024-09-16 DIAGNOSIS — J441 Chronic obstructive pulmonary disease with (acute) exacerbation: Secondary | ICD-10-CM | POA: Diagnosis not present

## 2024-09-16 DIAGNOSIS — Z87891 Personal history of nicotine dependence: Secondary | ICD-10-CM

## 2024-09-16 DIAGNOSIS — Z86718 Personal history of other venous thrombosis and embolism: Secondary | ICD-10-CM | POA: Diagnosis not present

## 2024-09-16 DIAGNOSIS — C180 Malignant neoplasm of cecum: Secondary | ICD-10-CM | POA: Diagnosis present

## 2024-09-16 DIAGNOSIS — Z800 Family history of malignant neoplasm of digestive organs: Secondary | ICD-10-CM

## 2024-09-16 DIAGNOSIS — Z5971 Insufficient health insurance coverage: Secondary | ICD-10-CM

## 2024-09-16 DIAGNOSIS — I82531 Chronic embolism and thrombosis of right popliteal vein: Secondary | ICD-10-CM | POA: Diagnosis present

## 2024-09-16 DIAGNOSIS — R0600 Dyspnea, unspecified: Secondary | ICD-10-CM

## 2024-09-16 DIAGNOSIS — Z23 Encounter for immunization: Secondary | ICD-10-CM | POA: Diagnosis present

## 2024-09-16 DIAGNOSIS — J449 Chronic obstructive pulmonary disease, unspecified: Secondary | ICD-10-CM | POA: Diagnosis present

## 2024-09-16 DIAGNOSIS — I480 Paroxysmal atrial fibrillation: Secondary | ICD-10-CM | POA: Diagnosis present

## 2024-09-16 DIAGNOSIS — Z9049 Acquired absence of other specified parts of digestive tract: Secondary | ICD-10-CM | POA: Diagnosis not present

## 2024-09-16 DIAGNOSIS — Z7901 Long term (current) use of anticoagulants: Secondary | ICD-10-CM

## 2024-09-16 DIAGNOSIS — Z8701 Personal history of pneumonia (recurrent): Secondary | ICD-10-CM

## 2024-09-16 DIAGNOSIS — G2581 Restless legs syndrome: Secondary | ICD-10-CM | POA: Diagnosis present

## 2024-09-16 DIAGNOSIS — K56609 Unspecified intestinal obstruction, unspecified as to partial versus complete obstruction: Secondary | ICD-10-CM | POA: Diagnosis present

## 2024-09-16 DIAGNOSIS — R140 Abdominal distension (gaseous): Secondary | ICD-10-CM | POA: Diagnosis present

## 2024-09-16 DIAGNOSIS — Z79899 Other long term (current) drug therapy: Secondary | ICD-10-CM

## 2024-09-16 DIAGNOSIS — K567 Ileus, unspecified: Secondary | ICD-10-CM | POA: Diagnosis present

## 2024-09-16 DIAGNOSIS — R112 Nausea with vomiting, unspecified: Secondary | ICD-10-CM | POA: Diagnosis present

## 2024-09-16 DIAGNOSIS — E440 Moderate protein-calorie malnutrition: Secondary | ICD-10-CM | POA: Diagnosis present

## 2024-09-16 DIAGNOSIS — Z6822 Body mass index (BMI) 22.0-22.9, adult: Secondary | ICD-10-CM

## 2024-09-16 DIAGNOSIS — R061 Stridor: Secondary | ICD-10-CM | POA: Diagnosis present

## 2024-09-16 DIAGNOSIS — E876 Hypokalemia: Secondary | ICD-10-CM | POA: Diagnosis present

## 2024-09-16 LAB — LIPASE, BLOOD: Lipase: 57 U/L — ABNORMAL HIGH (ref 11–51)

## 2024-09-16 LAB — CBC
HCT: 36.7 % (ref 36.0–46.0)
Hemoglobin: 11.3 g/dL — ABNORMAL LOW (ref 12.0–15.0)
MCH: 27.5 pg (ref 26.0–34.0)
MCHC: 30.8 g/dL (ref 30.0–36.0)
MCV: 89.3 fL (ref 80.0–100.0)
Platelets: 400 K/uL (ref 150–400)
RBC: 4.11 MIL/uL (ref 3.87–5.11)
RDW: 13.5 % (ref 11.5–15.5)
WBC: 7.9 K/uL (ref 4.0–10.5)
nRBC: 0 % (ref 0.0–0.2)

## 2024-09-16 LAB — COMPREHENSIVE METABOLIC PANEL WITH GFR
ALT: 14 U/L (ref 0–44)
AST: 17 U/L (ref 15–41)
Albumin: 2.9 g/dL — ABNORMAL LOW (ref 3.5–5.0)
Alkaline Phosphatase: 45 U/L (ref 38–126)
Anion gap: 16 — ABNORMAL HIGH (ref 5–15)
BUN: 13 mg/dL (ref 8–23)
CO2: 20 mmol/L — ABNORMAL LOW (ref 22–32)
Calcium: 8.4 mg/dL — ABNORMAL LOW (ref 8.9–10.3)
Chloride: 99 mmol/L (ref 98–111)
Creatinine, Ser: 0.53 mg/dL (ref 0.44–1.00)
GFR, Estimated: >60 mL/min (ref >60)
Glucose, Bld: 92 mg/dL (ref 70–99)
Potassium: 3.3 mmol/L — ABNORMAL LOW (ref 3.5–5.1)
Sodium: 135 mmol/L (ref 135–145)
Total Bilirubin: 0.7 mg/dL (ref 0.0–1.2)
Total Protein: 6.5 g/dL (ref 6.5–8.1)

## 2024-09-16 LAB — RESP PANEL BY RT-PCR (RSV, FLU A&B, COVID)  RVPGX2
Influenza A by PCR: NEGATIVE
Influenza B by PCR: NEGATIVE
Resp Syncytial Virus by PCR: NEGATIVE
SARS Coronavirus 2 by RT PCR: NEGATIVE

## 2024-09-16 MED ORDER — ESCITALOPRAM OXALATE 10 MG PO TABS
5.0000 mg | ORAL_TABLET | Freq: Every day | ORAL | Status: DC
  Administered 2024-09-17 – 2024-09-23 (×7): 5 mg via ORAL
  Filled 2024-09-16 (×7): qty 1

## 2024-09-16 MED ORDER — POTASSIUM CHLORIDE 10 MEQ/100ML IV SOLN
10.0000 meq | Freq: Once | INTRAVENOUS | Status: AC
  Administered 2024-09-16: 10 meq via INTRAVENOUS
  Filled 2024-09-16: qty 100

## 2024-09-16 MED ORDER — ACETAMINOPHEN 325 MG PO TABS: 650.0000 mg | ORAL_TABLET | Freq: Three times a day (TID) | ORAL | Status: DC | PRN

## 2024-09-16 MED ORDER — ONDANSETRON 4 MG PO TBDP: 4.0000 mg | ORAL_TABLET | Freq: Four times a day (QID) | ORAL | Status: DC | PRN

## 2024-09-16 MED ORDER — IOHEXOL 300 MG/ML  SOLN
100.0000 mL | Freq: Once | INTRAMUSCULAR | Status: AC | PRN
  Administered 2024-09-16: 100 mL via INTRAVENOUS

## 2024-09-16 MED ORDER — TRAMADOL HCL 50 MG PO TABS
50.0000 mg | ORAL_TABLET | Freq: Four times a day (QID) | ORAL | Status: DC | PRN
  Administered 2024-09-20: 50 mg via ORAL
  Filled 2024-09-16: qty 1

## 2024-09-16 MED ORDER — HYDROCODONE-ACETAMINOPHEN 5-325 MG PO TABS: 1.0000 | ORAL_TABLET | Freq: Three times a day (TID) | ORAL | Status: DC | PRN

## 2024-09-16 MED ORDER — ONDANSETRON HCL 4 MG/2ML IJ SOLN
4.0000 mg | Freq: Four times a day (QID) | INTRAMUSCULAR | Status: DC | PRN
  Administered 2024-09-16 – 2024-09-22 (×2): 4 mg via INTRAVENOUS
  Filled 2024-09-16 (×2): qty 2

## 2024-09-16 MED ORDER — SODIUM CHLORIDE 0.9 % IV BOLUS
1000.0000 mL | Freq: Once | INTRAVENOUS | Status: AC
  Administered 2024-09-16: 1000 mL via INTRAVENOUS

## 2024-09-16 MED ORDER — ROPINIROLE HCL 0.25 MG PO TABS
0.2500 mg | ORAL_TABLET | Freq: Every day | ORAL | Status: DC
  Administered 2024-09-16 – 2024-09-22 (×7): 0.25 mg via ORAL
  Filled 2024-09-16 (×7): qty 1

## 2024-09-16 MED ORDER — DABIGATRAN ETEXILATE MESYLATE 150 MG PO CAPS
150.0000 mg | ORAL_CAPSULE | Freq: Two times a day (BID) | ORAL | Status: DC
  Administered 2024-09-17 – 2024-09-23 (×13): 150 mg via ORAL
  Filled 2024-09-16 (×13): qty 1

## 2024-09-16 MED ORDER — ONDANSETRON HCL 4 MG/2ML IJ SOLN
4.0000 mg | Freq: Once | INTRAMUSCULAR | Status: AC
  Administered 2024-09-16: 4 mg via INTRAVENOUS
  Filled 2024-09-16: qty 2

## 2024-09-16 MED ORDER — SODIUM CHLORIDE 0.9 % IV SOLN
INTRAVENOUS | Status: DC
Start: 2024-09-16 — End: 2024-09-18

## 2024-09-16 MED ORDER — MORPHINE SULFATE (PF) 2 MG/ML IV SOLN
2.0000 mg | INTRAVENOUS | Status: DC | PRN
  Administered 2024-09-21: 2 mg via INTRAVENOUS
  Filled 2024-09-16: qty 1

## 2024-09-16 MED ORDER — TOPIRAMATE 25 MG PO TABS
50.0000 mg | ORAL_TABLET | Freq: Every day | ORAL | Status: DC
  Administered 2024-09-17 – 2024-09-23 (×7): 50 mg via ORAL
  Filled 2024-09-16 (×7): qty 2

## 2024-09-16 MED ORDER — DOCUSATE SODIUM 100 MG PO CAPS: 100.0000 mg | ORAL_CAPSULE | Freq: Two times a day (BID) | ORAL | Status: DC | PRN

## 2024-09-16 NOTE — H&P (Signed)
 Subjective:   CC: Ileus  HPI:  Monique [name] is a 73 y.o. female who is consulted by [facility] for evaluation of above cc.  Symptoms were first noted shortly after discharge from hospital 3 days ago.  Patient had decreased appetite, gradually transitioning to nausea and vomiting today.  Interestingly, she does states she had a very large bowel movement earlier today as well.  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  Past Surgical History:  has a past surgical history that includes Breast lumpectomy; Esophagogastroduodenoscopy (N/A, 09/06/2024); Colonoscopy (N/A, 09/06/2024); and Partial colectomy (N/A, 09/08/2024).  Family History: family history includes Emphysema in her maternal grandfather; Lung cancer in her maternal uncle; Stomach cancer in her maternal aunt.  Social History:  reports that she quit smoking about 12 years ago. Her smoking use included cigarettes. She started smoking about 54 years ago. She has a 33.6 pack-year smoking history. She has never used smokeless tobacco. She reports that she does not drink alcohol and does not use drugs.  Current Medications:  Prior to Admission medications   Medication Sig Start Date End Date Taking? Authorizing Provider  dabigatran (PRADAXA) 150 MG CAPS capsule Take 1 capsule (150 mg total) by mouth 2 (two) times daily. Patient does not have insurance- please use GoodRx: BIN [id number], PCN [id number], ID [id number], Grp GDRX 08/24/24   Herold Hadassah SQUIBB, MD  escitalopram  (LEXAPRO ) 5 MG tablet Take 1 tablet (5 mg total) by mouth daily. 07/02/24   Herold Hadassah SQUIBB, MD  oxyCODONE (OXY IR/ROXICODONE) 5 MG immediate release tablet Take 1 tablet (5 mg total) by mouth every 6 (six) hours as needed for moderate pain (pain score 4-6) or severe pain (pain score 7-10). 09/13/24   Schulz, Zachary R, PA-C  Prenatal MV-Min-Fe Fum-FA-DHA (PRENATAL/FOLIC  ACID+DHA PO) Take 1 tablet by mouth daily.    [provider]  topiramate (TOPAMAX) 50 MG tablet Take 1 tablet by mouth daily. 03/22/24   [provider]    Allergies:  Allergies as of 09/16/2024 - Reviewed 09/16/2024  Allergen Reaction Noted   Penicillins  12/30/2011    ROS:  Pertinent positives and negatives noted in HPI   Objective:     BP 118/68 (BP Location: Right Arm)   Pulse 94   Temp 98.6 F (37 C) (Oral)   Resp 16   Ht 5' 5 (1.651 m)   Wt 52.2 kg   LMP  (LMP Unknown)   SpO2 98%   BMI 19.14 kg/m    Constitutional :  alert, cooperative, appears stated age, and no distress  Respiratory:  Clear to auscultation bilaterally  Cardiovascular:  Regular rate and rhythm  Gastrointestinal: Soft, no guarding, minimal tenderness to palpation with obvious distention.   Skin: Cool and moist, staple line clean dry and intact  Psychiatric: Normal affect, non-agitated, not confused       LABS:     Latest Ref Rng & Units 09/16/2024    3:30 PM 09/13/2024    4:02 AM 09/12/2024    4:47 AM  CMP  Glucose 70 - 99 mg/dL 92  78  97   BUN 8 - 23 mg/dL 13  11  11    Creatinine 0.44 - 1.00 mg/dL 9.46  9.50  9.35   Sodium 135 - 145 mmol/L 135  133  131   Potassium 3.5 - 5.1 mmol/L 3.3  3.5  3.7   Chloride 98 -  111 mmol/L 99  98  96   CO2 22 - 32 mmol/L 20  22  25    Calcium 8.9 - 10.3 mg/dL 8.4  7.9  8.0   Total Protein 6.5 - 8.1 g/dL 6.5     Total Bilirubin 0.0 - 1.2 mg/dL 0.7     Alkaline Phos 38 - 126 U/L 45     AST 15 - 41 U/L 17     ALT 0 - 44 U/L 14         Latest Ref Rng & Units 09/16/2024    3:30 PM 09/12/2024    4:47 AM 09/10/2024    5:34 AM  CBC  WBC 4.0 - 10.5 K/uL 7.9  9.7  6.0   Hemoglobin 12.0 - 15.0 g/dL 88.6  9.9  89.9   Hematocrit 36.0 - 46.0 % 36.7  30.8  32.1   Platelets 150 - 400 K/uL 400  294  201      RADS: EXAM: CT ABDOMEN AND PELVIS WITH CONTRAST 09/16/2024 06:07:12 PM   TECHNIQUE: CT of the abdomen and pelvis was performed  with the administration of 100 mL of iohexol (OMNIPAQUE) 300 MG/ML solution. Multiplanar reformatted images are provided for review. Automated exposure control, iterative reconstruction, and/or weight-based adjustment of the mA/kV was utilized to reduce the radiation dose to as low as reasonably achievable.   COMPARISON: CT 09/03/2024   CLINICAL HISTORY: Nausea, vomiting, and diarrhea. History of partial colectomy in October with complaints of pain at the surgical site.   FINDINGS:   LOWER CHEST: Small right and trace left pleural effusions with associated atelectasis.   LIVER: The liver is unremarkable.   GALLBLADDER AND BILE DUCTS: Gallbladder is unremarkable. No biliary ductal dilatation.   SPLEEN: No acute abnormality.   PANCREAS: No acute abnormality.   ADRENAL GLANDS: No acute abnormality.   KIDNEYS, URETERS AND BLADDER: No stones in the kidneys or ureters. No hydronephrosis. No perinephric or periureteral stranding. Urinary bladder is unremarkable.   GI AND BOWEL: Interval postoperative change of partial colectomy with ileocolonic anastomosis in the right hemiabdomen. Distended stomach. Diffuse dilation of the small bowel with relatively smooth tapering to the decompressed ileum. No abrupt transition point. Findings favor postoperative ileus. However the extent of dilation and the time past since the operation on 10/15 are somewhat unexpected.   PERITONEUM AND RETROPERITONEUM: Loculated gas in the anterior abdomen near the umbilicus is favored to represent intramuscular air. Alternatively this may represent a locule of postoperative intraperitoneal air ( series 2 image 46). Free fluid and stranding in the right hemiabdomen is favored postoperative. Small 1.8x1.0 cm fluid collection in the right hemiabdomen on series 2 image 40 is favored a postoperative hematoma or seroma. Infection is not excluded by imaging.   VASCULATURE: Aorta is normal in caliber.    LYMPH NODES: No lymphadenopathy.   REPRODUCTIVE ORGANS: No acute abnormality.   BONES AND SOFT TISSUES: No acute osseous abnormality. No focal soft tissue abnormality.   IMPRESSION: 1. Interval postoperative change of partial colectomy with ileocolonic anastomosis in the right hemiabdomen. 2. Diffuse dilation of the small bowel with relatively smooth tapering to the decompressed ileum, without abrupt transition point, favoring postoperative ileus. The degree of dilation and timing is somewhat unexpected 8 days postoperatively. Consider gastrografin challenge for further evaluation. 3. Small amount of Free fluid and stranding in the right hemiabdomen is favored postoperative. Small 1.8 cm fluid collection in the right hemiabdomen is favored a postoperative hematoma or seroma. Infection is not  excluded by imaging. 4. Small right and trace left pleural effusions with associated atelectasis.   Electronically signed by: Norman Gatlin MD 09/16/2024 06:27 PM EDT RP Workstation: [id number]  Assessment:   Postoperative ileus versus narrow anastomosis causing slow transit of more solid foods?  Plan:   CT images reviewed personally and agree with the assessment above.  The degree of stomach and bowel distention is significant and the patient looks visibly uncomfortable so recommended NG tube decompression while continuing to monitor in the hospital for resolution and possible additional imaging in the morning.  Patient is agreeable.  Will admit, start IV fluids, NG tube decompression, serial abdominal exams.  Discussed DNR/DNI status and patient wishes no intervention and has a living will.  Healthcare surrogate is Niels Neas who is also her Engineer, technical sales, contact noted in EMR.  Patient with history of DVT on Pradaxa.  Will continue from home for now.  Continue all other home meds as well   The patient verbalized understanding and all questions were answered to the patient's  satisfaction.  labs/images/medications/previous chart entries reviewed personally and relevant changes/updates noted above.

## 2024-09-16 NOTE — ED Triage Notes (Signed)
 Pt arrives via EMS from home. Pt c/o nausea, vomiting, and diarrhea since yesterday. Pt is AxOx4. Pt c/o of abdominal pain at her surgical site where she recently had a partial colectomy on the 15th.

## 2024-09-16 NOTE — Progress Notes (Signed)
 Attempted to place NG tube three times with three different RN's and using the smallest one we have, without success. Patient states that she has very small nasal passages and has had issues with this in the past. Dr. Tye updated.

## 2024-09-16 NOTE — ED Triage Notes (Signed)
 First nurse note: Pt to ED via [facility] from home. Pt called her doctor and concerned for dehydration and told to come in. Pt discharged on 10/20 for colon cancer surgery. Pt reports started to have N/V since yesterday.

## 2024-09-16 NOTE — ED Provider Notes (Signed)
 [facility] Provider Note    Event Date/Time   First MD Initiated Contact with Patient 09/16/24 1658     (approximate)   History   No chief complaint on file.   HPI  Presli Fanguy is a 73 y.o. female with history of DVT on Pradaxa, MDD, restless leg syndrome, presenting with nausea vomiting diarrhea.  Symptoms started yesterday.  Had initially complained about pain along her phthisical site but states that she has no pain right now.  Was sent in for concerns of dehydration.  Was able to tolerate a very small amount of p.o. today but has been feeling very nauseous, states that she was not able to tolerate any p.o. yesterday.  She denies any hematochezia or melena.  No chest pain or shortness of breath, no fever or coughing.  No urinary symptoms.   On independent review, she was admitted in October for GI bleed, this was secondary to a cecal mass, she had to be transfused 2 units of packed RBCs, had an open right hemicolectomy with primary ileocolonic anastomosis of her cecal adenocarcinoma.     Physical Exam   Triage Vital Signs: ED Triage Vitals  Encounter Vitals Group     BP 09/16/24 1522 118/68     Girls Systolic BP Percentile --      Girls Diastolic BP Percentile --      Boys Systolic BP Percentile --      Boys Diastolic BP Percentile --      Pulse Rate 09/16/24 1522 94     Resp 09/16/24 1522 16     Temp 09/16/24 1522 98.6 F (37 C)     Temp Source 09/16/24 1522 Oral     SpO2 09/16/24 1522 98 %     Weight 09/16/24 1522 115 lb (52.2 kg)     Height 09/16/24 1522 5' 5 (1.651 m)     Head Circumference --      Peak Flow --      Pain Score 09/16/24 1527 3     Pain Loc --      Pain Education --      Exclude from Growth Chart --     Most recent vital signs: Vitals:   09/16/24 1522  BP: 118/68  Pulse: 94  Resp: 16  Temp: 98.6 F (37 C)  SpO2: 98%     General: Awake, no distress.  CV:  Good peripheral perfusion.  Resp:  Normal  effort.  Abd:  No distention.  Soft, nontender, midline surgical site is clean dry and intact, staples are in place, no purulent drainage, there is a 3 cm area where is mildly erythematous middle of her surgical site but no palpable area fluctuance, no induration Other:  Dry mucous membranes   ED Results / Procedures / Treatments   Labs (all labs ordered are listed, but only abnormal results are displayed) Labs Reviewed  LIPASE, BLOOD - Abnormal; Notable for the following components:      Result Value   Lipase 57 (*)    All other components within normal limits  COMPREHENSIVE METABOLIC PANEL WITH GFR - Abnormal; Notable for the following components:   Potassium 3.3 (*)    CO2 20 (*)    Calcium 8.4 (*)    Albumin 2.9 (*)    Anion gap 16 (*)    All other components within normal limits  CBC - Abnormal; Notable for the following components:   Hemoglobin 11.3 (*)    All  other components within normal limits  RESP PANEL BY RT-PCR (RSV, FLU A&B, COVID)  RVPGX2  C DIFFICILE QUICK SCREEN W PCR REFLEX    GASTROINTESTINAL PANEL BY PCR, STOOL (REPLACES STOOL CULTURE)  URINALYSIS, ROUTINE W REFLEX MICROSCOPIC      RADIOLOGY On my independent interpretation, CT shows air-fluid levels concerning for SBO   PROCEDURES:  Critical Care performed: No  Procedures   MEDICATIONS ORDERED IN ED: Medications  sodium chloride 0.9 % bolus 1,000 mL (1,000 mLs Intravenous New Bag/Given 09/16/24 1727)  ondansetron (ZOFRAN) injection 4 mg (4 mg Intravenous Given 09/16/24 1728)  potassium chloride 10 mEq in 100 mL IVPB (10 mEq Intravenous New Bag/Given 09/16/24 1735)  iohexol (OMNIPAQUE) 300 MG/ML solution 100 mL (100 mLs Intravenous Contrast Given 09/16/24 1747)     IMPRESSION / MDM / ASSESSMENT AND PLAN / ED COURSE  I reviewed the triage vital signs and the nursing notes.                              Differential diagnosis includes, but is not limited to, dehydration, electrolyte  derangements, viral illness, given her recent hospitalization and surgery, did also consider C. difficile, partial SBO, SBO, her surgical site is clean dry and intact, there is a small area of erythema that could irritation, there is no induration, warmth, fluctuance, did consider cellulitis but this is less likely, no fluctuance to suggest abscess.  Will get labs, UA, CT, IV fluids, IV Zofran.  Patient's presentation is most consistent with acute presentation with potential threat to life or bodily function.  Independent interpretation of labs and imaging below.  Clinical course as below.  Surgery evaluated the patient and will admit the patient.  She is admitted.    Clinical Course as of 09/16/24 1906  Thu Sep 16, 2024  1701 Independent review of labs, lipase is mildly elevated, no leukocytosis, electrolytes not severely deranged, her potassium is mildly low, will replete, LFTs are normal. [TT]  1817 On my independent read, CT imaging shows air-fluid levels with dilated bowel concerning for SBO.  Reach out to Dr. Tye from surgery who will take a look at the images and discussed with Dr. Marinda from surgery.  States that he will assess the patient and see if they will admit her or if she needs to go to hospitalist for further management. [TT]  1840 CT ABDOMEN PELVIS W CONTRAST IMPRESSION: 1. Interval postoperative change of partial colectomy with ileocolonic anastomosis in the right hemiabdomen. 2. Diffuse dilation of the small bowel with relatively smooth tapering to the decompressed ileum, without abrupt transition point, favoring postoperative ileus. The degree of dilation and timing is somewhat unexpected 8 days postoperatively. Consider gastrografin challenge for further evaluation. 3. Small amount of Free fluid and stranding in the right hemiabdomen is favored postoperative. Small 1.8 cm fluid collection in the right hemiabdomen is favored a postoperative hematoma or seroma. Infection  is not excluded by imaging. 4. Small right and trace left pleural effusions with associated atelectasis.   [TT]    Clinical Course User Index [TT] [facility], Lorelle Cummins, MD     FINAL CLINICAL IMPRESSION(S) / ED DIAGNOSES   Final diagnoses:  Nausea vomiting and diarrhea  SBO (small bowel obstruction) (HCC)     Rx / DC Orders   ED Discharge Orders     None        Note:  This document was prepared using Dragon voice recognition  software and may include unintentional dictation errors.    [facility] Lorelle Cummins, MD 09/16/24 [phone]

## 2024-09-17 ENCOUNTER — Inpatient Hospital Stay

## 2024-09-17 LAB — URINALYSIS, ROUTINE W REFLEX MICROSCOPIC
Bacteria, UA: NONE SEEN
Bilirubin Urine: NEGATIVE
Glucose, UA: NEGATIVE mg/dL
Ketones, ur: 20 mg/dL — AB
Leukocytes,Ua: NEGATIVE
Nitrite: NEGATIVE
Protein, ur: NEGATIVE mg/dL
Specific Gravity, Urine: 1.011 (ref 1.005–1.030)
pH: 5 (ref 5.0–8.0)

## 2024-09-17 LAB — BASIC METABOLIC PANEL WITH GFR
Anion gap: 10 (ref 5–15)
BUN: 10 mg/dL (ref 8–23)
CO2: 20 mmol/L — ABNORMAL LOW (ref 22–32)
Calcium: 7.7 mg/dL — ABNORMAL LOW (ref 8.9–10.3)
Chloride: 108 mmol/L (ref 98–111)
Creatinine, Ser: 0.52 mg/dL (ref 0.44–1.00)
GFR, Estimated: >60 mL/min (ref >60)
Glucose, Bld: 72 mg/dL (ref 70–99)
Potassium: 3.3 mmol/L — ABNORMAL LOW (ref 3.5–5.1)
Sodium: 138 mmol/L (ref 135–145)

## 2024-09-17 LAB — CBC
HCT: 29.7 % — ABNORMAL LOW (ref 36.0–46.0)
Hemoglobin: 9.3 g/dL — ABNORMAL LOW (ref 12.0–15.0)
MCH: 27.3 pg (ref 26.0–34.0)
MCHC: 31.3 g/dL (ref 30.0–36.0)
MCV: 87.1 fL (ref 80.0–100.0)
Platelets: 293 K/uL (ref 150–400)
RBC: 3.41 MIL/uL — ABNORMAL LOW (ref 3.87–5.11)
RDW: 13.5 % (ref 11.5–15.5)
WBC: 5 K/uL (ref 4.0–10.5)
nRBC: 0 % (ref 0.0–0.2)

## 2024-09-17 MED ORDER — POTASSIUM CHLORIDE 10 MEQ/100ML IV SOLN
10.0000 meq | INTRAVENOUS | Status: AC
  Administered 2024-09-17 (×2): 10 meq via INTRAVENOUS
  Filled 2024-09-17 (×3): qty 100

## 2024-09-17 MED ORDER — PHENOL 1.4 % MT LIQD
1.0000 | OROMUCOSAL | Status: DC | PRN
  Administered 2024-09-18 (×2): 1 via OROMUCOSAL
  Filled 2024-09-17: qty 177

## 2024-09-17 MED ORDER — PANTOPRAZOLE SODIUM 40 MG IV SOLR
40.0000 mg | Freq: Two times a day (BID) | INTRAVENOUS | Status: DC
  Administered 2024-09-17 – 2024-09-23 (×13): 40 mg via INTRAVENOUS
  Filled 2024-09-17 (×14): qty 10

## 2024-09-17 MED ORDER — BENZOCAINE 20 % MT SOLN
1.0000 | Freq: Once | OROMUCOSAL | Status: AC
Start: 2024-09-17 — End: 2024-09-17
  Administered 2024-09-17: 1 via OROMUCOSAL
  Filled 2024-09-17: qty 1

## 2024-09-17 NOTE — Plan of Care (Signed)
  Problem: Clinical Measurements: Goal: Ability to maintain clinical measurements within normal limits will improve Outcome: Progressing   Problem: Clinical Measurements: Goal: Will remain free from infection Outcome: Progressing   Problem: Clinical Measurements: Goal: Diagnostic test results will improve Outcome: Progressing   Problem: Nutrition: Goal: Adequate nutrition will be maintained Outcome: Progressing   Problem: Nutrition: Goal: Adequate nutrition will be maintained Outcome: Progressing

## 2024-09-17 NOTE — Plan of Care (Signed)

## 2024-09-17 NOTE — Progress Notes (Addendum)
 [facility] SURGICAL PROGRESS NOTE  Hospital Day(s): 1.   Interval History:  Patient seen and examined No acute events or new complaints overnight.  Patient reports she is feeling rough Still with abdominal soreness Nausea She is without leukocytosis; WBC 5.0K Hgb to 9.3 Renal function normal; sCr - 0.52; UO - unmeasured Hypokalemia to 3.3 She did have efforts for NGT placement overnight which were unsuccessful No flatus   Vital signs in last 24 hours: [min-max] current  Temp:  [98 F (36.7 C)-98.6 F (37 C)] 98 F (36.7 C) (10/24 0755) Pulse Rate:  [88-94] 90 (10/24 0755) Resp:  [16-19] 19 (10/24 0755) BP: (118-152)/(55-68) 129/67 (10/24 0755) SpO2:  [95 %-98 %] 97 % (10/24 0755) Weight:  [52.2 kg] 52.2 kg (10/23 1522)     Height: 5' 5 (165.1 cm) Weight: 52.2 kg BMI (Calculated): 19.14   Intake/Output last 2 shifts:  10/23 0701 - 10/24 0700 In: 308.2 [I.V.:208.2; IV Piggyback:100] Out: -    Physical Exam:  Constitutional: alert, cooperative and no distress  HEENT: I was able to place NGT in the right nare at 60 cm, immediate return of bilious output, ~500 ccs  Respiratory: breathing non-labored at rest  Cardiovascular: regular rate and sinus rhythm  Gastrointestinal: Soft, mild distension, no rebound/guarding Integumentary: Laparotomy is CDI with staple, no erythema   Labs:     Latest Ref Rng & Units 09/17/2024    6:49 AM 09/16/2024    3:30 PM 09/12/2024    4:47 AM  CBC  WBC 4.0 - 10.5 K/uL 5.0  7.9  9.7   Hemoglobin 12.0 - 15.0 g/dL 9.3  88.6  9.9   Hematocrit 36.0 - 46.0 % 29.7  36.7  30.8   Platelets 150 - 400 K/uL 293  400  294       Latest Ref Rng & Units 09/17/2024    6:49 AM 09/16/2024    3:30 PM 09/13/2024    4:02 AM  CMP  Glucose 70 - 99 mg/dL 72  92  78   BUN 8 - 23 mg/dL 10  13  11    Creatinine 0.44 - 1.00 mg/dL 9.47  9.46  9.50   Sodium 135 - 145 mmol/L 138  135  133   Potassium 3.5 - 5.1 mmol/L 3.3  3.3  3.5   Chloride  98 - 111 mmol/L 108  99  98   CO2 22 - 32 mmol/L 20  20  22    Calcium 8.9 - 10.3 mg/dL 7.7  8.4  7.9   Total Protein 6.5 - 8.1 g/dL  6.5    Total Bilirubin 0.0 - 1.2 mg/dL  0.7    Alkaline Phos 38 - 126 U/L  45    AST 15 - 41 U/L  17    ALT 0 - 44 U/L  14       Imaging studies: No new pertinent imaging studies   Assessment/Plan:  73 y.o. 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 Will get confirmation/positioning XR  - She will need to be NPO; IVF support  - Replete K+': monitor  - No need for emergent surgical intervention   - Monitor abdominal examination; on-going bowel function  - Will get morning KUB  - Pain control prn; antiemetics prn   - Morning labs   - Mobilize;  can clamp NGT to allow this   All of the above findings and recommendations were discussed with the patient, and the medical team, and all of patient's questions were answered to her expressed satisfaction.  -- Arthea Platt, PA-C [facility] 09/17/2024, 10:02 AM M-F: 7am - 4pm

## 2024-09-18 ENCOUNTER — Inpatient Hospital Stay

## 2024-09-18 DIAGNOSIS — K567 Ileus, unspecified: Secondary | ICD-10-CM

## 2024-09-18 DIAGNOSIS — K9189 Other postprocedural complications and disorders of digestive system: Secondary | ICD-10-CM

## 2024-09-18 LAB — CBC
HCT: 30.9 % — ABNORMAL LOW (ref 36.0–46.0)
Hemoglobin: 9.5 g/dL — ABNORMAL LOW (ref 12.0–15.0)
MCH: 27.1 pg (ref 26.0–34.0)
MCHC: 30.7 g/dL (ref 30.0–36.0)
MCV: 88.3 fL (ref 80.0–100.0)
Platelets: 282 K/uL (ref 150–400)
RBC: 3.5 MIL/uL — ABNORMAL LOW (ref 3.87–5.11)
RDW: 13.3 % (ref 11.5–15.5)
WBC: 5.2 K/uL (ref 4.0–10.5)
nRBC: 0 % (ref 0.0–0.2)

## 2024-09-18 LAB — BASIC METABOLIC PANEL WITH GFR
Anion gap: 17 — ABNORMAL HIGH (ref 5–15)
BUN: 6 mg/dL — ABNORMAL LOW (ref 8–23)
CO2: 18 mmol/L — ABNORMAL LOW (ref 22–32)
Calcium: 8 mg/dL — ABNORMAL LOW (ref 8.9–10.3)
Chloride: 102 mmol/L (ref 98–111)
Creatinine, Ser: 0.44 mg/dL (ref 0.44–1.00)
GFR, Estimated: >60 mL/min (ref >60)
Glucose, Bld: 60 mg/dL — ABNORMAL LOW (ref 70–99)
Potassium: 2.9 mmol/L — ABNORMAL LOW (ref 3.5–5.1)
Sodium: 137 mmol/L (ref 135–145)

## 2024-09-18 LAB — MAGNESIUM: Magnesium: 1.6 mg/dL — ABNORMAL LOW (ref 1.7–2.4)

## 2024-09-18 MED ORDER — KCL IN DEXTROSE-NACL 20-5-0.9 MEQ/L-%-% IV SOLN
INTRAVENOUS | Status: AC
  Filled 2024-09-18 (×2): qty 1000

## 2024-09-18 MED ORDER — MAGNESIUM SULFATE 2 GM/50ML IV SOLN
2.0000 g | Freq: Once | INTRAVENOUS | Status: AC
Start: 2024-09-18 — End: 2024-09-19
  Administered 2024-09-18: 2 g via INTRAVENOUS
  Filled 2024-09-18: qty 50

## 2024-09-18 MED ORDER — POTASSIUM CHLORIDE 10 MEQ/100ML IV SOLN
10.0000 meq | INTRAVENOUS | Status: AC
  Administered 2024-09-18 (×4): 10 meq via INTRAVENOUS
  Filled 2024-09-18 (×4): qty 100

## 2024-09-18 NOTE — Plan of Care (Signed)

## 2024-09-18 NOTE — Progress Notes (Signed)
 Mobility Specialist Progress Note:    09/18/24 1515  Mobility  Activity Ambulated with assistance  Level of Assistance Standby assist, set-up cues, supervision of patient - no hands on  Assistive Device None  Distance Ambulated (ft) 150 ft  Range of Motion/Exercises Active;All extremities  Activity Response Tolerated well  Mobility visit 1 Mobility  Mobility Specialist Start Time (ACUTE ONLY) 1451  Mobility Specialist Stop Time (ACUTE ONLY) 1517  Mobility Specialist Time Calculation (min) (ACUTE ONLY) 26 min   Pt received requesting assistance, required supervision to stand and ambulate with no AD. Tolerated well, asx throughout. Returned to room, left supine. Notified nurse, all needs met.  Sherrilee Ditty Mobility Specialist Please contact via Special Educational Needs Teacher or  Rehab office at [phone]

## 2024-09-18 NOTE — Plan of Care (Signed)
  Problem: Clinical Measurements: Goal: Ability to maintain clinical measurements within normal limits will improve Outcome: Progressing   Problem: Clinical Measurements: Goal: Will remain free from infection Outcome: Progressing   Problem: Nutrition: Goal: Adequate nutrition will be maintained Outcome: Progressing   Problem: Activity: Goal: Risk for activity intolerance will decrease Outcome: Progressing   Problem: Elimination: Goal: Will not experience complications related to bowel motility Outcome: Progressing   Problem: Coping: Goal: Level of anxiety will decrease Outcome: Progressing

## 2024-09-18 NOTE — Progress Notes (Signed)
 09/18/2024  Subjective: No acute events overnight.  Patient denies any flatus or bowel function.  NG tube output elevated at 1150 yesterday.  Denies any worsening abdominal pain.  Vital signs: Temp:  [97.5 F (36.4 C)-98.4 F (36.9 C)] 97.5 F (36.4 C) (10/25 0759) Pulse Rate:  [78-90] 83 (10/25 0759) Resp:  [16-18] 18 (10/25 0759) BP: (117-134)/(53-61) 132/61 (10/25 0759) SpO2:  [97 %-99 %] 99 % (10/25 0759)   Intake/Output: 10/24 0701 - 10/25 0700 In: 1103.4 [P.O.:240; I.V.:765.7; IV Piggyback:97.7] Out: 1150 [Emesis/NG output:1150] Last BM Date : 09/16/24  Physical Exam: Constitutional: No acute distress Abdomen: Soft, distended, with some soreness to palpation.  Midline incision is clean, dry, intact with staples in place.  Labs:  Recent Labs    09/17/24 0649 09/18/24 0515  WBC 5.0 5.2  HGB 9.3* 9.5*  HCT 29.7* 30.9*  PLT 293 282   Recent Labs    09/17/24 0649 09/18/24 0515  NA 138 137  K 3.3* 2.9*  CL 108 102  CO2 20* 18*  GLUCOSE 72 60*  BUN 10 6*  CREATININE 0.52 0.44  CALCIUM 7.7* 8.0*   No results for input(s): LABPROT, INR in the last 72 hours.  Imaging: DG ABD ACUTE 2+V W 1V CHEST Result Date: 09/18/2024 EXAM: UPRIGHT AND SUPINE XRAY VIEWS OF THE ABDOMEN AND 4 VIEW(S) OF THE CHEST 09/18/2024 04:51:00 AM COMPARISON: 09/17/2024 CLINICAL HISTORY: Ileus FINDINGS: LUNGS AND PLEURA: No consolidation or pulmonary edema. Trace bilateral pleural effusions. No pneumothorax. HEART AND MEDIASTINUM: No acute abnormality of the cardiac and mediastinal silhouettes. BOWEL: Multiple gas-filled dilated loops of small bowel. No bowel obstruction. PERITONEUM AND SOFT TISSUES: Nasogastric tube tip and side port overlie the stomach. Midline surgical staples. Aortic atherosclerosis. No abnormal calcifications. No free air. BONES: No acute osseous abnormality. IMPRESSION: 1. Multiple gas-filled dilated loops of small bowel consistent with ileus. Electronically signed by:  Waddell Calk MD 09/18/2024 05:31 AM EDT RP Workstation: [id number]    Assessment/Plan: This is a 73 y.o. female with postoperative ileus.  - Discussed with patient again the findings on her imaging showing that she still has distended loops of small bowel.  Discussed with her how even though after her initial bowel function after her surgery, it is not rare to see patients come back with postoperative ileus.  At this point, we just have to wait for her bowels to wake up.  Discussed with her that she can try sucking on hard candy or chewing bubblegum as this may help with improvement of bowel function. - Otherwise for now, continue n.p.o., NG tube to suction, and IV fluid hydration. - No IV antibiotics needed. - IV PPI and continue Pradaxa for DVT treatment.   I spent 35 minutes dedicated to the care of this patient on the date of this encounter to include pre-visit review of records, face-to-face time with the patient discussing diagnosis and management, and any post-visit coordination of care.  Aloysius Sheree Plant, MD [facility]

## 2024-09-19 LAB — BASIC METABOLIC PANEL WITH GFR
Anion gap: 15 (ref 5–15)
BUN: <5 mg/dL — ABNORMAL LOW (ref 8–23)
CO2: 21 mmol/L — ABNORMAL LOW (ref 22–32)
Calcium: 8.1 mg/dL — ABNORMAL LOW (ref 8.9–10.3)
Chloride: 102 mmol/L (ref 98–111)
Creatinine, Ser: 0.41 mg/dL — ABNORMAL LOW (ref 0.44–1.00)
GFR, Estimated: >60 mL/min (ref >60)
Glucose, Bld: 123 mg/dL — ABNORMAL HIGH (ref 70–99)
Potassium: 3.3 mmol/L — ABNORMAL LOW (ref 3.5–5.1)
Sodium: 138 mmol/L (ref 135–145)

## 2024-09-19 LAB — MAGNESIUM: Magnesium: 2 mg/dL (ref 1.7–2.4)

## 2024-09-19 LAB — CBC
HCT: 34.4 % — ABNORMAL LOW (ref 36.0–46.0)
Hemoglobin: 11.1 g/dL — ABNORMAL LOW (ref 12.0–15.0)
MCH: 27.8 pg (ref 26.0–34.0)
MCHC: 32.3 g/dL (ref 30.0–36.0)
MCV: 86 fL (ref 80.0–100.0)
Platelets: 301 K/uL (ref 150–400)
RBC: 4 MIL/uL (ref 3.87–5.11)
RDW: 13.3 % (ref 11.5–15.5)
WBC: 6.9 K/uL (ref 4.0–10.5)
nRBC: 0 % (ref 0.0–0.2)

## 2024-09-19 NOTE — Progress Notes (Addendum)
 Mobility Specialist Progress Note:    09/19/24 0800  Mobility  Activity Ambulated with assistance  Level of Assistance Standby assist, set-up cues, supervision of patient - no hands on  Assistive Device None  Distance Ambulated (ft) 150 ft  Range of Motion/Exercises Active;All extremities  Activity Response Tolerated well  Mobility visit 1 Mobility  Mobility Specialist Start Time (ACUTE ONLY) 0756  Mobility Specialist Stop Time (ACUTE ONLY) 0808  Mobility Specialist Time Calculation (min) (ACUTE ONLY) 12 min   Pt received in bed, agreeable to mobility. RN in room to clamp NG tube. Required SBA to stand and ambulate with no AD. Tolerated well, asx throughout. Returned to room, left pt supine with HOB elevated greater than 40 degrees. Notified nurse, all needs met.  Sherrilee Ditty Mobility Specialist Please contact via Special Educational Needs Teacher or  Rehab office at [phone]

## 2024-09-19 NOTE — Plan of Care (Signed)
  Problem: Clinical Measurements: Goal: Ability to maintain clinical measurements within normal limits will improve Outcome: Progressing   Problem: Clinical Measurements: Goal: Diagnostic test results will improve Outcome: Progressing   Problem: Clinical Measurements: Goal: Will remain free from infection Outcome: Progressing   Problem: Elimination: Goal: Will not experience complications related to bowel motility Outcome: Progressing

## 2024-09-19 NOTE — Progress Notes (Signed)
 09/19/2024  Subjective: No acute events overnight.  Patient reports that she started having flatus and she had one last night and 1 this morning.  No stool yet.  NG output however remains high at 1100 mL.  Vital signs: Temp:  [97.8 F (36.6 C)-98.8 F (37.1 C)] 98.8 F (37.1 C) (10/26 0736) Pulse Rate:  [85-98] 98 (10/26 0736) Resp:  [14-18] 17 (10/26 0736) BP: (122-156)/(65-71) 149/71 (10/26 0736) SpO2:  [97 %-99 %] 97 % (10/26 0736)   Intake/Output: 10/25 0701 - 10/26 0700 In: 2060.6 [I.V.:1494.7; NG/GT:130; IV Piggyback:435.9] Out: 2550 [Urine:1450; Emesis/NG output:1100] Last BM Date : 09/16/24  Physical Exam: Constitutional: No acute distress Abdomen: Soft, distended, with some soreness to palpation.  Midline incision is clean, dry, intact with staples in place.  Labs:  Recent Labs    09/18/24 0515 09/19/24 0636  WBC 5.2 6.9  HGB 9.5* 11.1*  HCT 30.9* 34.4*  PLT 282 301   Recent Labs    09/18/24 0515 09/19/24 0636  NA 137 138  K 2.9* 3.3*  CL 102 102  CO2 18* 21*  GLUCOSE 60* 123*  BUN 6* <5*  CREATININE 0.44 0.41*  CALCIUM 8.0* 8.1*   No results for input(s): LABPROT, INR in the last 72 hours.  Imaging: No results found.   Assessment/Plan: This is a 73 y.o. female with postoperative ileus.  - The patient is slowly making improvements with starting to have some flatus last night and this morning.  Her NG output is still quite high and as such I discussed with patient that we will continue the NG tube to suction today.  Once she has more consistent bowel function most likely tomorrow will be able to start clamping the NG tube. - Otherwise for now, continue n.p.o., NG tube to suction, and IV fluid hydration. - No IV antibiotics needed. - IV PPI and continue Pradaxa for DVT treatment.   I spent 35 minutes dedicated to the care of this patient on the date of this encounter to include pre-visit review of records, face-to-face time with the patient  discussing diagnosis and management, and any post-visit coordination of care.  Aloysius Sheree Plant, MD [facility]

## 2024-09-20 ENCOUNTER — Inpatient Hospital Stay

## 2024-09-20 LAB — CBC
HCT: 37.6 % (ref 36.0–46.0)
Hemoglobin: 11.7 g/dL — ABNORMAL LOW (ref 12.0–15.0)
MCH: 27.1 pg (ref 26.0–34.0)
MCHC: 31.1 g/dL (ref 30.0–36.0)
MCV: 87.2 fL (ref 80.0–100.0)
Platelets: 332 K/uL (ref 150–400)
RBC: 4.31 MIL/uL (ref 3.87–5.11)
RDW: 13.6 % (ref 11.5–15.5)
WBC: 8.1 K/uL (ref 4.0–10.5)
nRBC: 0 % (ref 0.0–0.2)

## 2024-09-20 LAB — BASIC METABOLIC PANEL WITH GFR
Anion gap: 9 (ref 5–15)
BUN: 7 mg/dL — ABNORMAL LOW (ref 8–23)
CO2: 25 mmol/L (ref 22–32)
Calcium: 8.4 mg/dL — ABNORMAL LOW (ref 8.9–10.3)
Chloride: 102 mmol/L (ref 98–111)
Creatinine, Ser: 0.49 mg/dL (ref 0.44–1.00)
GFR, Estimated: >60 mL/min (ref >60)
Glucose, Bld: 84 mg/dL (ref 70–99)
Potassium: 3 mmol/L — ABNORMAL LOW (ref 3.5–5.1)
Sodium: 136 mmol/L (ref 135–145)

## 2024-09-20 LAB — MAGNESIUM: Magnesium: 1.8 mg/dL (ref 1.7–2.4)

## 2024-09-20 MED ORDER — KCL IN DEXTROSE-NACL 20-5-0.9 MEQ/L-%-% IV SOLN
INTRAVENOUS | Status: AC
  Filled 2024-09-20 (×4): qty 1000

## 2024-09-20 MED ORDER — BUDESONIDE 0.25 MG/2ML IN SUSP
0.2500 mg | Freq: Two times a day (BID) | RESPIRATORY_TRACT | Status: DC
  Administered 2024-09-20: 0.25 mg via RESPIRATORY_TRACT
  Filled 2024-09-20: qty 2

## 2024-09-20 MED ORDER — POTASSIUM CHLORIDE 10 MEQ/100ML IV SOLN
10.0000 meq | INTRAVENOUS | Status: AC
  Administered 2024-09-20 (×6): 10 meq via INTRAVENOUS
  Filled 2024-09-20 (×3): qty 100

## 2024-09-20 MED ORDER — ALBUTEROL SULFATE (2.5 MG/3ML) 0.083% IN NEBU
2.5000 mg | INHALATION_SOLUTION | Freq: Four times a day (QID) | RESPIRATORY_TRACT | Status: DC | PRN
  Filled 2024-09-20: qty 3

## 2024-09-20 NOTE — Progress Notes (Signed)
 [facility] SURGICAL PROGRESS NOTE  Hospital Day(s): 4.   Interval History:  Patient seen and examined No acute events or new complaints overnight.  Patient reports she is feeling better Biggest complain is phlegm and discomfort from NGT She is without leukocytosis; WBC 8.1K Hgb to 11.7 Renal function normal; sCr - 0.49; UO - 1200 ccs + unmeasured Hypokalemia to 3.0 NGT with 950 ccs out; this is very dilute in tubing She is passing flatus   Vital signs in last 24 hours: [min-max] current  Temp:  [98 F (36.7 C)-99.4 F (37.4 C)] 98 F (36.7 C) (10/27 0506) Pulse Rate:  [89-99] 89 (10/27 0506) Resp:  [16-17] 16 (10/27 0506) BP: (126-154)/(62-88) 126/62 (10/27 0506) SpO2:  [96 %] 96 % (10/27 0506) Weight:  [51.4 kg] 51.4 kg (10/27 0418)     Height: 5' 5 (165.1 cm) Weight: 51.4 kg BMI (Calculated): 18.86   Intake/Output last 2 shifts:  10/26 0701 - 10/27 0700 In: 302.5 [P.O.:60; I.V.:51.7; NG/GT:180; IV Piggyback:10.7] Out: 2150 [Urine:1200; Emesis/NG output:950]   Physical Exam:  Constitutional: alert, cooperative and no distress  HEENT: NGT in place; output in tubing very dilute  Respiratory: breathing non-labored at rest  Cardiovascular: regular rate and sinus rhythm  Gastrointestinal: Soft, she does not appear tender, non-distended, no rebound/guarding  Integumentary: Laparotomy is CDI with staple, no erythema   Labs:     Latest Ref Rng & Units 09/20/2024    5:31 AM 09/19/2024    6:36 AM 09/18/2024    5:15 AM  CBC  WBC 4.0 - 10.5 K/uL 8.1  6.9  5.2   Hemoglobin 12.0 - 15.0 g/dL 88.2  88.8  9.5   Hematocrit 36.0 - 46.0 % 37.6  34.4  30.9   Platelets 150 - 400 K/uL 332  301  282       Latest Ref Rng & Units 09/20/2024    5:31 AM 09/19/2024    6:36 AM 09/18/2024    5:15 AM  CMP  Glucose 70 - 99 mg/dL 84  876  60   BUN 8 - 23 mg/dL 7  <5  6   Creatinine 9.55 - 1.00 mg/dL 9.50  9.58  9.55   Sodium 135 - 145 mmol/L 136  138  137   Potassium  3.5 - 5.1 mmol/L 3.0  3.3  2.9   Chloride 98 - 111 mmol/L 102  102  102   CO2 22 - 32 mmol/L 25  21  18    Calcium 8.9 - 10.3 mg/dL 8.4  8.1  8.0      Imaging studies: No new pertinent imaging studies   Assessment/Plan:  73 y.o. 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 If KUB is reassuring, will get 4 hour gravity trial of NGT.  - Continue NPO for now; IVF support  - Replete K+: monitor  - No need for emergent surgical intervention   - Monitor abdominal examination; on-going bowel function  - Pain control prn; antiemetics prn   - Mobilize; can clamp NGT to allow this  - Will remove staples prior to DC  All of the above findings and recommendations were discussed with the patient, and the medical team, and all of patient's questions were answered to her expressed satisfaction.  --  Arthea Platt, PA-C [facility] 09/20/2024, 8:33 AM M-F: 7am - 4pm

## 2024-09-20 NOTE — Care Management Important Message (Signed)
 Important Message  Patient Details  Name: Monique [name] MRN: [id number] Date of Birth: 09/19/1951   Important Message Given:  Yes - Medicare IM     Rojelio SHAUNNA Rattler 09/20/2024, 4:43 PM

## 2024-09-20 NOTE — Progress Notes (Signed)
 Mobility Specialist Progress Note:    09/20/24 0927  Mobility  Activity Ambulated with assistance;Pivoted/transferred from bed to chair  Level of Assistance Contact guard assist, steadying assist  Assistive Device None  Distance Ambulated (ft) 160 ft  Range of Motion/Exercises Active;All extremities  Activity Response Tolerated well  Mobility visit 1 Mobility  Mobility Specialist Start Time (ACUTE ONLY) [id number]  Mobility Specialist Stop Time (ACUTE ONLY) 0910  Mobility Specialist Time Calculation (min) (ACUTE ONLY) 12 min   Pt received in bed, agreeable to mobility. Required CGA to stand and ambulate with no AD. Tolerated well, experiencing some LOB throughout today's session. Returned to room, left pt in chair. NT in room, all needs met.  Sherrilee Ditty Mobility Specialist Please contact via Special Educational Needs Teacher or  Rehab office at [phone]

## 2024-09-20 NOTE — Progress Notes (Signed)
NGT clamped.  

## 2024-09-20 NOTE — Progress Notes (Signed)
 NGT unclamped

## 2024-09-20 NOTE — Progress Notes (Signed)
 Patient has ambulated the entire corridor around the nurses station on three different occasions this shift with standby assistance.

## 2024-09-20 NOTE — Plan of Care (Signed)
  Problem: Health Behavior/Discharge Planning: Goal: Ability to manage health-related needs will improve Outcome: Progressing   Problem: Clinical Measurements: Goal: Diagnostic test results will improve Outcome: Progressing Goal: Respiratory complications will improve Outcome: Progressing Goal: Cardiovascular complication will be avoided Outcome: Progressing   Problem: Activity: Goal: Risk for activity intolerance will decrease Outcome: Progressing   Problem: Nutrition: Goal: Adequate nutrition will be maintained Outcome: Progressing   Problem: Coping: Goal: Level of anxiety will decrease Outcome: Progressing   

## 2024-09-21 ENCOUNTER — Encounter: Payer: Self-pay | Admitting: General Surgery

## 2024-09-21 ENCOUNTER — Inpatient Hospital Stay

## 2024-09-21 DIAGNOSIS — I480 Paroxysmal atrial fibrillation: Secondary | ICD-10-CM | POA: Insufficient documentation

## 2024-09-21 DIAGNOSIS — R0600 Dyspnea, unspecified: Secondary | ICD-10-CM

## 2024-09-21 LAB — CBC
HCT: 36.9 % (ref 36.0–46.0)
Hemoglobin: 11.8 g/dL — ABNORMAL LOW (ref 12.0–15.0)
MCH: 27.3 pg (ref 26.0–34.0)
MCHC: 32 g/dL (ref 30.0–36.0)
MCV: 85.4 fL (ref 80.0–100.0)
Platelets: 297 K/uL (ref 150–400)
RBC: 4.32 MIL/uL (ref 3.87–5.11)
RDW: 13.4 % (ref 11.5–15.5)
WBC: 9.1 K/uL (ref 4.0–10.5)
nRBC: 0 % (ref 0.0–0.2)

## 2024-09-21 LAB — BASIC METABOLIC PANEL WITH GFR
Anion gap: 10 (ref 5–15)
BUN: 6 mg/dL — ABNORMAL LOW (ref 8–23)
CO2: 25 mmol/L (ref 22–32)
Calcium: 8.1 mg/dL — ABNORMAL LOW (ref 8.9–10.3)
Chloride: 100 mmol/L (ref 98–111)
Creatinine, Ser: 0.32 mg/dL — ABNORMAL LOW (ref 0.44–1.00)
GFR, Estimated: >60 mL/min (ref >60)
Glucose, Bld: 166 mg/dL — ABNORMAL HIGH (ref 70–99)
Potassium: 3.6 mmol/L (ref 3.5–5.1)
Sodium: 135 mmol/L (ref 135–145)

## 2024-09-21 LAB — PHOSPHORUS: Phosphorus: 3.8 mg/dL (ref 2.5–4.6)

## 2024-09-21 LAB — MAGNESIUM: Magnesium: 1.8 mg/dL (ref 1.7–2.4)

## 2024-09-21 LAB — VITAMIN D 25 HYDROXY (VIT D DEFICIENCY, FRACTURES): Vit D, 25-Hydroxy: 31.09 ng/mL (ref 30–100)

## 2024-09-21 LAB — FOLATE: Folate: >20 ng/mL (ref >5.9)

## 2024-09-21 MED ORDER — POLYETHYLENE GLYCOL 3350 17 G PO PACK
17.0000 g | PACK | Freq: Every day | ORAL | Status: DC
  Administered 2024-09-21 – 2024-09-23 (×3): 17 g via ORAL
  Filled 2024-09-21 (×3): qty 1

## 2024-09-21 MED ORDER — ARFORMOTEROL TARTRATE 15 MCG/2ML IN NEBU
15.0000 ug | INHALATION_SOLUTION | Freq: Two times a day (BID) | RESPIRATORY_TRACT | Status: DC
  Administered 2024-09-21 – 2024-09-23 (×5): 15 ug via RESPIRATORY_TRACT
  Filled 2024-09-21 (×6): qty 2

## 2024-09-21 MED ORDER — RACEPINEPHRINE HCL 2.25 % IN NEBU
0.5000 mL | INHALATION_SOLUTION | RESPIRATORY_TRACT | Status: DC | PRN
  Administered 2024-09-21: 0.5 mL via RESPIRATORY_TRACT
  Filled 2024-09-21 (×2): qty 15

## 2024-09-21 MED ORDER — METHYLPREDNISOLONE SODIUM SUCC 40 MG IJ SOLR
40.0000 mg | Freq: Two times a day (BID) | INTRAMUSCULAR | Status: AC
  Administered 2024-09-21 – 2024-09-22 (×2): 40 mg via INTRAVENOUS
  Filled 2024-09-21 (×2): qty 1

## 2024-09-21 MED ORDER — BUDESONIDE 0.5 MG/2ML IN SUSP
2.0000 mg | Freq: Two times a day (BID) | RESPIRATORY_TRACT | Status: DC
  Administered 2024-09-21: 2 mg via RESPIRATORY_TRACT
  Administered 2024-09-21: 0.5 mg via RESPIRATORY_TRACT
  Administered 2024-09-22 – 2024-09-23 (×3): 2 mg via RESPIRATORY_TRACT
  Filled 2024-09-21 (×5): qty 8

## 2024-09-21 MED ORDER — METHYLPREDNISOLONE SODIUM SUCC 125 MG IJ SOLR
125.0000 mg | Freq: Once | INTRAMUSCULAR | Status: AC
  Administered 2024-09-21: 125 mg via INTRAVENOUS
  Filled 2024-09-21: qty 2

## 2024-09-21 MED ORDER — GUAIFENESIN ER 600 MG PO TB12
600.0000 mg | ORAL_TABLET | Freq: Two times a day (BID) | ORAL | Status: DC
  Administered 2024-09-21 – 2024-09-23 (×5): 600 mg via ORAL
  Filled 2024-09-21 (×5): qty 1

## 2024-09-21 MED ORDER — BOOST / RESOURCE BREEZE PO LIQD CUSTOM
1.0000 | Freq: Three times a day (TID) | ORAL | Status: DC
  Administered 2024-09-21: 1 via ORAL

## 2024-09-21 MED ORDER — PREDNISONE 20 MG PO TABS
40.0000 mg | ORAL_TABLET | Freq: Every day | ORAL | Status: DC
  Administered 2024-09-23: 40 mg via ORAL
  Filled 2024-09-21: qty 2

## 2024-09-21 MED ORDER — TRAZODONE HCL 50 MG PO TABS
50.0000 mg | ORAL_TABLET | Freq: Every evening | ORAL | Status: DC | PRN
  Administered 2024-09-21 – 2024-09-22 (×2): 50 mg via ORAL
  Filled 2024-09-21 (×2): qty 1

## 2024-09-21 MED ORDER — IPRATROPIUM-ALBUTEROL 0.5-2.5 (3) MG/3ML IN SOLN
3.0000 mL | Freq: Three times a day (TID) | RESPIRATORY_TRACT | Status: DC
  Administered 2024-09-22 – 2024-09-23 (×4): 3 mL via RESPIRATORY_TRACT
  Filled 2024-09-21 (×4): qty 3

## 2024-09-21 MED ORDER — IPRATROPIUM-ALBUTEROL 0.5-2.5 (3) MG/3ML IN SOLN
3.0000 mL | Freq: Four times a day (QID) | RESPIRATORY_TRACT | Status: DC
  Administered 2024-09-21 (×3): 3 mL via RESPIRATORY_TRACT
  Filled 2024-09-21 (×3): qty 3

## 2024-09-21 NOTE — Progress Notes (Addendum)
 Initial Nutrition Assessment  DOCUMENTATION CODES:   Non-severe (moderate) malnutrition in context of social or environmental circumstances  INTERVENTION:   Recommend TPN if unable to advance pt's diet within the next 24 hrs  Boost Breeze po TID, each supplement provides 250 kcal and 9 grams of protein  Ensure Plus High Protein po BID with diet advancement, each supplement provides 350 kcal and 20 grams of protein  MVI po daily with diet advancement   Thiamine 100mg  po daily x 7 days if refeeding occurs  Pt at high refeed risk; recommend monitor potassium, magnesium and phosphorus labs daily until stable  Daily weights  NUTRITION DIAGNOSIS:   Moderate Malnutrition related to social / environmental circumstances as evidenced by moderate fat depletion, moderate muscle depletion.  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  Visited pt's room today. Pt sleeping at the time of RD visit; RD did not wake patient. Per chart review, pt eating only sips and bites prior to her discharge on 10/20. Pt developed nausea and vomiting on 10/22. Pt has been NPO since 10/23 and is now without adequate nutrition for > 7 days. Per chart, pt is down ~2lbs from her last admission. Pt initiated on a clear liquid diet today. RD will add supplements to help pt meet her estimated needs. Pt is at high refeed risk. Would recommend TPN initiation if patient is unable to advance her diet within the next 24 hrs.   Medications reviewed and include: prednisone, miralax, protonix  Labs reviewed: K 3.6 wnl, BUN 6(L), creat 0.32(L), P  3.8 wnl, Mg 1.8 wnl  NUTRITION - FOCUSED PHYSICAL EXAM:  Flowsheet Row Most Recent Value  Orbital Region Mild depletion  Upper Arm Region Moderate depletion  Thoracic and Lumbar Region Moderate depletion  Buccal Region Mild depletion  Temple Region Mild depletion  Clavicle Bone Region Moderate depletion  Clavicle and Acromion Bone Region Moderate depletion  Scapular Bone Region Unable to assess  Dorsal Hand Moderate depletion  Patellar Region Severe depletion  Anterior Thigh Region Severe depletion  Posterior Calf Region Severe depletion  Edema (RD Assessment) None  Hair Reviewed  Eyes Reviewed  Mouth Reviewed  Skin Reviewed  Nails Reviewed   Diet Order:   Diet Order             Diet clear liquid Fluid consistency: Thin  Diet effective now                  EDUCATION NEEDS:   No education needs have been identified at this time  Skin:  Skin Assessment: Reviewed RN Assessment (incision abdomen)  Last BM:  10/23  Height:   Ht Readings from Last 1 Encounters:  09/16/24 5' 5 (1.651 m)    Weight:   Wt Readings from Last 1 Encounters:  09/20/24 51.4 kg    Ideal Body Weight:  56.8 kg  BMI:  Body mass index is 18.86 kg/m.  Estimated Nutritional Needs:   Kcal:  1600-1800kcal/day  Protein:  80-90g/day  Fluid:  1.5-1.7L/day  Augustin Shams MS, RD, LDN If unable to be reached, please  send secure chat to RD inpatient available from 8:00a-4:00p daily

## 2024-09-21 NOTE — Plan of Care (Signed)
 Patient was seen and examined at the bedside in the morning. Patient was admitted to general surgery for postop complication, ileus which has been resolved, NG tube was removed.  Patient is passing gas and moving bowels.  General surgery managing diet. TRH was consulted for shortness of breath. It seems patient is having COPD exacerbation S/p Solu-Medrol 125 mg x 1 dose given early morning, started Solu-Medrol 40 mg IV twice daily x 2 doses followed by prednisone 40 mg p.o. daily for 3 days. PPI continued for GI prophylaxis Started Mucinex 600 twice daily Brovana and Pulmicort  nebulizer twice a day DuoNeb every 6 hourly  No charge note, same-day consult note was done. TRH will continue to follow along with general surgery team. DC plan in 1 to 2 days

## 2024-09-21 NOTE — Consult Note (Signed)
 Initial Consultation Note   Patient: Monique [name] FMW:[id number] DOB: [birth year] PCP: Herold Hadassah SQUIBB, MD DOA: 09/16/2024 DOS: the patient was seen and examined on 09/21/2024 Primary service: Marinda Jayson KIDD, MD  Referring physician: Dr. Cesar Reason for consult: Stat consult for shortness of breath and wheezing  Assessment/Plan: Assessment and Plan: * Postoperative ileus (HCC) Continued management per surgical team  Acute dyspnea Suspect COPD exacerbation(hospitalized 2013 for same) Possible aspiration pneumonitis-patient has NG tube and ileus Low suspicion for anaphylactic reaction No improvement following Pulmicort  x 1 Ordered racemic epinephrine recommended by respiratory therapy however no improvement DuoNebs every 6 with albuterol as needed Will order DuoNebs Q6 Ordered Solu-Medrol x 1 IV as a one-time dose Continue Pulmicort  to decrease systemic steroids given recent surgery Aspiration precautions  History of paroxysmal atrial fibrillation (HCC) History of paroxysmal A-fib 2013 in the setting of acute respiratory illness Baseline EKG  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  HPI: Monique [name] is a 73 y.o. female with episode of paroxysmal A-fib (2013), chronic DVT on dabigatran, admitted to the surgical service with postoperative ileus s/p partial colectomy 10/15 for cecal mass, being seen in consultation for shortness of breath and wheezing that started suddenly on the night of 09/20/2024.  She denies a history of COPD however on chart review patient was hospitalized back in 2013 with a diagnosis of COPD exacerbation/pneumonia with hypoxia in the setting of tobacco use disorder.  She was discharged on home oxygen at the time.  She was also noted to be in A-fib she denies similar symptoms in the past.  Denies chest pain.  Denies feeling like her  throat is closing in or drooling..  She has a history of childhood allergic reaction of to penicillin of bleeding.  She denies any other drug allergies, contrast or shellfish allergy, environmental allergies or allergies to insect bites.  She was given a treatment of Pulmicort  prior to consultation without improvement and was also evaluated by respiratory therapist who recommended racemic epinephrine.  Symptoms did not improve with racemic epi.  Review of Systems: As mentioned in the history of present illness. All other systems reviewed and are negative. 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   Past Surgical History:  Procedure Laterality Date   BREAST LUMPECTOMY     Rt breast   COLONOSCOPY N/A 09/06/2024   Procedure: COLONOSCOPY;  Surgeon: Jinny Carmine, MD;  Location: [facility] ENDOSCOPY;  Service: Endoscopy;  Laterality: N/A;   ESOPHAGOGASTRODUODENOSCOPY N/A 09/06/2024   Procedure: EGD (ESOPHAGOGASTRODUODENOSCOPY);  Surgeon: Jinny Carmine, MD;  Location: [facility] ENDOSCOPY;  Service: Endoscopy;  Laterality: N/A;   PARTIAL COLECTOMY N/A 09/08/2024   Procedure: COLECTOMY, PARTIAL;  Surgeon: Marinda Jayson KIDD, MD;  Location: [facility] ORS;  Service: General;  Laterality: N/A;   Social History:  reports that she quit smoking about 12 years ago. Her smoking use included cigarettes. She started smoking about 54 years ago. She has a 33.6 pack-year smoking history. She has never used smokeless tobacco. She reports that she does not drink alcohol and does not use drugs.  Allergies  Allergen Reactions   Penicillins     bleeding    Family History  Problem Relation Age of Onset   Emphysema Maternal Grandfather  smoked a pipe   Stomach cancer Maternal Aunt    Lung cancer Maternal Uncle        was a smoker    Prior to Admission medications   Medication Sig Start Date End Date Taking?  Authorizing Provider  dabigatran (PRADAXA) 150 MG CAPS capsule Take 1 capsule (150 mg total) by mouth 2 (two) times daily. Patient does not have insurance- please use GoodRx: BIN [id number], PCN GDC, ID [id number], Grp GDRX 08/24/24  Yes Herold Hadassah SQUIBB, MD  escitalopram  (LEXAPRO ) 5 MG tablet Take 1 tablet (5 mg total) by mouth daily. 07/02/24  Yes Herold Hadassah SQUIBB, MD  oxyCODONE (OXY IR/ROXICODONE) 5 MG immediate release tablet Take 1 tablet (5 mg total) by mouth every 6 (six) hours as needed for moderate pain (pain score 4-6) or severe pain (pain score 7-10). 09/13/24  Yes Schulz, Zachary R, PA-C  Prenatal MV-Min-Fe Fum-FA-DHA (PRENATAL/FOLIC ACID+DHA PO) Take 1 tablet by mouth daily.   Yes [provider]  rOPINIRole (REQUIP) 0.25 MG tablet Take 0.25 mg by mouth at bedtime.   Yes [provider]  topiramate (TOPAMAX) 50 MG tablet Take 1 tablet by mouth daily. 03/22/24  Yes [provider]    Physical Exam: Vitals:   09/20/24 1937 09/20/24 2002 09/21/24 0022 09/21/24 0110  BP:  (!) 158/77    Pulse:      Resp:  18    Temp:  98.4 F (36.9 C)    TempSrc:      SpO2: 91% 95% 98% 98%  Weight:      Height:       Physical Exam Vitals and nursing note reviewed.  Constitutional:      General: She is not in acute distress. HENT:     Head: Normocephalic and atraumatic.  Cardiovascular:     Rate and Rhythm: Normal rate and regular rhythm.     Heart sounds: Normal heart sounds.  Pulmonary:     Effort: Pulmonary effort is normal.     Comments: Transmitted sounds.  No stridor Slight retractions Tachypnea Speaking in short sentences Abdominal:     Palpations: Abdomen is soft.     Tenderness: There is no abdominal tenderness.  Neurological:     Mental Status: Mental status is at baseline.     Data Reviewed: Relevant notes from primary care and specialist visits, past discharge summaries as available in EHR, including Care Everywhere. Prior diagnostic testing as  pertinent to current admission diagnoses Updated medications and problem lists for reconciliation ED course, including vitals, labs, imaging, treatment and response to treatment Triage notes, nursing and pharmacy notes and ED provider's notes Notable results as noted in HPI   Family Communication: none Primary team communication: Dr Cesar Thank you very much for involving us  in the care of your patient.  Author: Delayne LULLA Solian, MD 09/21/2024 3:32 AM  For on call review [email or web address].

## 2024-09-21 NOTE — Progress Notes (Signed)
 Patient ID: Monique [name], female   DOB: June 11, 1951, 73 y.o.   MRN: [id number]  Came to see patient due to concern of worsening respiratory effort.  Patient endorses that this morning she was having some shortness of breath that improved with albuterol and Pulmicort .  Now patient is complaining about a pressure to feel like a bar on her chest.  She feels that she is panicking because it is very difficult to take a deep breath.  Her O2 sat is 98% with nasal cannula at 2 L.  She endorses chest pain 7 out of 10.  I ordered a chest x-ray without any finding concerning of aspiration or pneumonia.  Upon evaluation patient without respiratory breathing, more upper stridor.  She is alert, oriented x 3.  Vitals:   09/20/24 2002 09/21/24 0022  BP: (!) 158/77   Pulse:    Resp: 18   Temp: 98.4 F (36.9 C)   SpO2: 95% 98%   I contacted the hospitalist for further evaluation and management of respiratory stridor and chest pain.  Lucas Sjogren, MD

## 2024-09-21 NOTE — Assessment & Plan Note (Addendum)
 Suspect COPD exacerbation(hospitalized 2013 for same) Possible aspiration pneumonitis-patient has NG tube and ileus Low suspicion for anaphylactic reaction No improvement following Pulmicort  x 1 Ordered racemic epinephrine recommended by respiratory therapy however no improvement DuoNebs every 6 with albuterol as needed Will order DuoNebs Q6 Ordered Solu-Medrol x 1 IV as a one-time dose Continue Pulmicort  to decrease systemic steroids given recent surgery Aspiration precautions

## 2024-09-21 NOTE — Assessment & Plan Note (Signed)
 Continued management per surgical team

## 2024-09-21 NOTE — Progress Notes (Addendum)
 Pt c/o feeling like hard to breath. Auscultation sounds like stridor. Respiratory paged to listen as well and agree. Dr.cintron called and notified. Awaiting orders Dr cesar and dr cleatus to floor. Orders placed

## 2024-09-21 NOTE — Progress Notes (Signed)
 [facility] SURGICAL PROGRESS NOTE  Hospital Day(s): 5.   Interval History:  Patient seen and examined Overnight with difficulty breathing thought to be potentially stridor. She did get breathing treatments which have helped CXR overnight without acute changes Patient still with upper airway congestion Abdomen much less distended She is without leukocytosis; WBC 9.1K Hgb to 11.8 Renal function normal; sCr - 0.32; UO - 1200 ccs + unmeasured No electrolyte derangments  NGT has been clamped yesterday; suction restarted last night with minimal output  She is passing flatus; + BM  Vital signs in last 24 hours: [min-max] current  Temp:  [97.9 F (36.6 C)-98.5 F (36.9 C)] 98.5 F (36.9 C) (10/28 0341) Pulse Rate:  [87-104] 87 (10/28 0341) Resp:  [16-18] 16 (10/28 0341) BP: (130-161)/(69-84) 153/84 (10/28 0341) SpO2:  [91 %-100 %] 94 % (10/28 0531)     Height: 5' 5 (165.1 cm) Weight: 51.4 kg BMI (Calculated): 18.86   Intake/Output last 2 shifts:  10/27 0701 - 10/28 0700 In: 908.5 [I.V.:908.5] Out: 0    Physical Exam:  Constitutional: alert, cooperative and no distress  HEENT: NGT in place; output in tubing very dilute (removed) Respiratory: With congested upper airway sounds, on [place], no respiratory distress Cardiovascular: regular rate and sinus rhythm  Gastrointestinal: Soft, she does not appear tender, non-distended, no rebound/guarding  Integumentary: Laparotomy is CDI with staple, no erythema   Labs:     Latest Ref Rng & Units 09/21/2024    5:50 AM 09/20/2024    5:31 AM 09/19/2024    6:36 AM  CBC  WBC 4.0 - 10.5 K/uL 9.1  8.1  6.9   Hemoglobin 12.0 - 15.0 g/dL 88.1  88.2  88.8   Hematocrit 36.0 - 46.0 % 36.9  37.6  34.4   Platelets 150 - 400 K/uL 297  332  301       Latest Ref Rng & Units 09/21/2024    5:50 AM 09/20/2024    5:31 AM 09/19/2024    6:36 AM  CMP  Glucose 70 - 99 mg/dL 833  84  876   BUN 8 - 23 mg/dL 6  7  <5   Creatinine 9.55 -  1.00 mg/dL 9.67  9.50  9.58   Sodium 135 - 145 mmol/L 135  136  138   Potassium 3.5 - 5.1 mmol/L 3.6  3.0  3.3   Chloride 98 - 111 mmol/L 100  102  102   CO2 22 - 32 mmol/L 25  25  21    Calcium 8.9 - 10.3 mg/dL 8.1  8.4  8.1      Imaging studies: No new pertinent imaging studies   Assessment/Plan:  73 y.o. 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 Hopefully this will improve with NGT removal as well; will follow closely. If she starts to desaturate, we will plan for CTA; clinical suspicion is low currently   - No need for emergent surgical intervention   - Monitor abdominal examination; on-going bowel function  - Pain control prn; antiemetics prn   - Mobilize  - Will remove staples prior to DC  All of the above findings and recommendations were discussed with the patient, and the medical team, and all of patient's questions were answered to her expressed satisfaction.  --  Arthea Platt, PA-C [facility] 09/21/2024, 8:03 AM M-F: 7am - 4pm

## 2024-09-21 NOTE — Assessment & Plan Note (Signed)
 History of paroxysmal A-fib 2013 in the setting of acute respiratory illness Baseline EKG

## 2024-09-21 NOTE — Evaluation (Signed)
 Physical Therapy Evaluation Patient Details Name: Monique [name] MRN: [id number] DOB: 03-06-1951 Today's Date: 09/21/2024  History of Present Illness  73 y/o female presented to ED on 09/16/24 for possible dehydration. Admitted for post operative ileus following R hemicolectomy. Recent hospitalization 10/10-10/20/25 for colon cancer surgery. PMH: iron deficiency anemia, DVT, restless leg syndrome, MDD, colon cancer  Clinical Impression  Patient admitted with the above. PTA, patient lives alone and was modI for mobility with no AD. Patient presents with weakness and balance deficits. Completed bed mobility and sit to stand modI. Ambulated in hallway with CGA-supervision with x1 significant LOB but able to self correct with stepping strategy. Cues for improving foot clearance for rest of ambulation. SpO2 94% on RA at end of session. Patient will benefit from skilled PT services during acute stay to address listed deficits.     If plan is discharge home, recommend the following: A little help with walking and/or transfers;Help with stairs or ramp for entrance   Can travel by private vehicle        Equipment Recommendations None recommended by PT  Recommendations for Other Services       Functional Status Assessment Patient has had a recent decline in their functional status and demonstrates the ability to make significant improvements in function in a reasonable and predictable amount of time.     Precautions / Restrictions Precautions Precautions: Fall Recall of Precautions/Restrictions: Intact Restrictions Weight Bearing Restrictions Per Provider Order: No      Mobility  Bed Mobility Overal bed mobility: Modified Independent                  Transfers Overall transfer level: Modified independent Equipment used: None                    Ambulation/Gait Ambulation/Gait assistance: Contact guard assist, Supervision Gait Distance (Feet): 300 Feet Assistive  device: None Gait Pattern/deviations: Step-through pattern, Decreased stride length, Drifts right/left (decreased foot clearance) Gait velocity: decreased     General Gait Details: siginficant LOB x 1 but able to self correct with stepping strategy. Patient forgetting to put up feet and tripping/catching foot on ground. CGA-supervision throughout with cues for picking her feet up  Stairs            Wheelchair Mobility     Tilt Bed    Modified Rankin (Stroke Patients Only)       Balance Overall balance assessment: Mild deficits observed, not formally tested                                           Pertinent Vitals/Pain Pain Assessment Pain Assessment: No/denies pain    Home Living Family/patient expects to be discharged to:: Private residence Living Arrangements: Alone Available Help at Discharge: Other (Comment) (no real help available) Type of Home: House Home Access: Stairs to enter Entrance Stairs-Rails: None Entrance Stairs-Number of Steps: 1   Home Layout: One level Home Equipment: Production Assistant, Radio - single point      Prior Function Prior Level of Function : Independent/Modified Independent             Mobility Comments: independent, no device ADLs Comments: independent     Extremity/Trunk Assessment   Upper Extremity Assessment Upper Extremity Assessment: Generalized weakness    Lower Extremity Assessment Lower Extremity Assessment: Generalized weakness    Cervical / Trunk Assessment Cervical /  Trunk Assessment: Normal  Communication   Communication Communication: No apparent difficulties    Cognition Arousal: Alert Behavior During Therapy: WFL for tasks assessed/performed, Flat affect   PT - Cognitive impairments: Safety/Judgement                         Following commands: Intact       Cueing       General Comments      Exercises     Assessment/Plan    PT Assessment Patient needs  continued PT services  PT Problem List Decreased strength;Decreased balance;Decreased activity tolerance       PT Treatment Interventions DME instruction;Gait training;Functional mobility training;Therapeutic activities;Therapeutic exercise;Balance training;Patient/family education    PT Goals (Current goals can be found in the Care Plan section)  Acute Rehab PT Goals Patient Stated Goal: to go home PT Goal Formulation: With patient Time For Goal Achievement: 10/05/24 Potential to Achieve Goals: Good    Frequency Min 1X/week     Co-evaluation               AM-PAC PT 6 Clicks Mobility  Outcome Measure Help needed turning from your back to your side while in a flat bed without using bedrails?: None Help needed moving from lying on your back to sitting on the side of a flat bed without using bedrails?: None Help needed moving to and from a bed to a chair (including a wheelchair)?: None Help needed standing up from a chair using your arms (e.g., wheelchair or bedside chair)?: None Help needed to walk in [name] room?: A Little Help needed climbing 3-5 steps with a railing? : A Little 6 Click Score: 22    End of Session   Activity Tolerance: Patient tolerated treatment well Patient left: in bed;with call bell/phone within reach;with bed alarm set Nurse Communication: Mobility status PT Visit Diagnosis: Unsteadiness on feet (R26.81)    Time: 8895-8879 PT Time Calculation (min) (ACUTE ONLY): 16 min   Charges:   PT Evaluation $PT Eval Low Complexity: 1 Low   PT General Charges $$ ACUTE PT VISIT: 1 Visit         Monique [name], PT, DPT Physical Therapist - [name]  Monique [name]  Monique [name] 09/21/2024, 1:17 PM

## 2024-09-21 NOTE — Progress Notes (Signed)
 Mobility Specialist Progress Note:    09/21/24 1658  Mobility  Activity Ambulated with assistance  Level of Assistance Contact guard assist, steadying assist  Assistive Device None  Distance Ambulated (ft) 20 ft  Range of Motion/Exercises Active;All extremities  Activity Response Tolerated well  Mobility visit 1 Mobility  Mobility Specialist Start Time (ACUTE ONLY) 1656  Mobility Specialist Stop Time (ACUTE ONLY) 1710  Mobility Specialist Time Calculation (min) (ACUTE ONLY) 14 min   Assisted pt to bathroom with CGA, deferred further mobility d/t fatigue. Alarm on and belongings in reach. All needs met.  Sherrilee Ditty Mobility Specialist Please contact via Special Educational Needs Teacher or  Rehab office at [phone]

## 2024-09-21 NOTE — Progress Notes (Signed)
 Mobility Specialist Progress Note:    09/21/24 0956  Mobility  Activity Ambulated with assistance  Level of Assistance Contact guard assist, steadying assist  Assistive Device None  Range of Motion/Exercises Active;All extremities  Activity Response Tolerated well  Mobility visit 1 Mobility  Mobility Specialist Start Time (ACUTE ONLY) 0930  Mobility Specialist Stop Time (ACUTE ONLY) 0956  Mobility Specialist Time Calculation (min) (ACUTE ONLY) 26 min   Pt received requesting assistance. Required CGA to stand and ambulate with no AD d/t 2 LOB. Tolerated well, SpO2 93% on RA throughout. Returned supine, alarm on. All needs met.  Sherrilee Ditty Mobility Specialist Please contact via Special Educational Needs Teacher or  Rehab office at [phone]

## 2024-09-21 NOTE — Plan of Care (Signed)

## 2024-09-21 NOTE — Assessment & Plan Note (Signed)
 Currently on dabigatran

## 2024-09-22 DIAGNOSIS — I480 Paroxysmal atrial fibrillation: Secondary | ICD-10-CM

## 2024-09-22 DIAGNOSIS — J441 Chronic obstructive pulmonary disease with (acute) exacerbation: Secondary | ICD-10-CM

## 2024-09-22 DIAGNOSIS — E440 Moderate protein-calorie malnutrition: Secondary | ICD-10-CM | POA: Insufficient documentation

## 2024-09-22 LAB — BASIC METABOLIC PANEL WITH GFR
Anion gap: 11 (ref 5–15)
BUN: 12 mg/dL (ref 8–23)
CO2: 28 mmol/L (ref 22–32)
Calcium: 8.6 mg/dL — ABNORMAL LOW (ref 8.9–10.3)
Chloride: 100 mmol/L (ref 98–111)
Creatinine, Ser: 0.5 mg/dL (ref 0.44–1.00)
GFR, Estimated: >60 mL/min (ref >60)
Glucose, Bld: 133 mg/dL — ABNORMAL HIGH (ref 70–99)
Potassium: 4.2 mmol/L (ref 3.5–5.1)
Sodium: 139 mmol/L (ref 135–145)

## 2024-09-22 LAB — MAGNESIUM: Magnesium: 1.8 mg/dL (ref 1.7–2.4)

## 2024-09-22 LAB — CBC
HCT: 34.6 % — ABNORMAL LOW (ref 36.0–46.0)
Hemoglobin: 10.7 g/dL — ABNORMAL LOW (ref 12.0–15.0)
MCH: 27.1 pg (ref 26.0–34.0)
MCHC: 30.9 g/dL (ref 30.0–36.0)
MCV: 87.6 fL (ref 80.0–100.0)
Platelets: 337 K/uL (ref 150–400)
RBC: 3.95 MIL/uL (ref 3.87–5.11)
RDW: 13.4 % (ref 11.5–15.5)
WBC: 7.3 K/uL (ref 4.0–10.5)
nRBC: 0 % (ref 0.0–0.2)

## 2024-09-22 LAB — PHOSPHORUS: Phosphorus: 4.6 mg/dL (ref 2.5–4.6)

## 2024-09-22 MED ORDER — ENSURE PLUS HIGH PROTEIN PO LIQD
237.0000 mL | Freq: Three times a day (TID) | ORAL | Status: DC
Start: 2024-09-22 — End: 2024-09-23
  Administered 2024-09-23: 237 mL via ORAL

## 2024-09-22 MED ORDER — INFLUENZA VAC SPLIT HIGH-DOSE 0.5 ML IM SUSY
0.5000 mL | PREFILLED_SYRINGE | INTRAMUSCULAR | Status: AC
  Administered 2024-09-23: 0.5 mL via INTRAMUSCULAR
  Filled 2024-09-22 (×2): qty 0.5

## 2024-09-22 MED ORDER — ADULT MULTIVITAMIN W/MINERALS CH
1.0000 | ORAL_TABLET | Freq: Every day | ORAL | Status: DC
  Administered 2024-09-23: 1 via ORAL
  Filled 2024-09-22: qty 1

## 2024-09-22 MED ORDER — THIAMINE HCL 100 MG PO TABS
100.0000 mg | ORAL_TABLET | Freq: Every day | ORAL | Status: DC
  Administered 2024-09-22 – 2024-09-23 (×2): 100 mg via ORAL
  Filled 2024-09-22 (×4): qty 1

## 2024-09-22 NOTE — Progress Notes (Signed)
  Progress Note   Patient: Monique [name] FMW:[id number] DOB: November 11, 1951 DOA: 09/16/2024     6 DOS: the patient was seen and examined on 09/22/2024   Brief hospital course: Monique [name] is a 73 y.o. female with episode of paroxysmal A-fib (2013), chronic DVT on dabigatran, admitted to the surgical service with postoperative ileus s/p partial colectomy 10/15 for cecal mass, being seen in consultation for shortness of breath and wheezing that started suddenly on the night of 09/20/2024.  Patient had 50 years of smoking history, she was diagnosed with COPD exacerbation.  She was given steroids, also started on bronchodilator. Short of breath resolved, patient feels back to baseline.   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  Tolerating diet.  Acute dyspnea COPD exacerbation(hospitalized 2013 for same) Possible aspiration pneumonitis-patient has NG tube and ileus Patient condition has improved.  Based on presentation and reactive to treatment, patient probably had a COPD exacerbation.  Short of breath has back to baseline.  Will complete a 4-day course of steroids.  No need for antibiotics, aspiration pneumonitis most likely due to chemical not bacterial. Patient condition has improved, no need to see patient again tomorrow.  Will sign off.  Call if there is any questions.  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  Had a bowel movement  Physical Exam: Vitals:   09/21/24 1926 09/21/24 2047 09/22/24 0411 09/22/24 0500  BP: 120/86  (!) 105/55   Pulse: 85  83   Resp: 18  17   Temp: 98.5 F (36.9 C)  98 F (36.7 C)   TempSrc:   Oral   SpO2: 100% 100% 98%   Weight:    52.5 kg  Height:       General exam: Appears calm and comfortable  Respiratory system: Decreased breath sounds. Respiratory effort normal. Cardiovascular system: S1 & S2 heard, RRR. No JVD, murmurs, rubs, gallops or clicks. No pedal edema. Gastrointestinal system: Abdomen is nondistended, soft and nontender. No organomegaly or masses felt. Normal bowel sounds heard. Central nervous system: Alert and oriented. No focal neurological deficits. Extremities: Symmetric 5 x 5 power. Skin: No rashes, lesions or ulcers Psychiatry: Judgement and insight appear normal. Mood & affect appropriate.    Data Reviewed:  Lab results reviewed  Family Communication: None  Disposition: Status is: Inpatient Remains inpatient appropriate because: Per general surgery     Time spent: 35 minutes  Author: Murvin Mana, MD 09/22/2024 10:57 AM  For on call review [email or web address].

## 2024-09-22 NOTE — Progress Notes (Signed)
 [name] SUR[facility]ICAL PROGRESS NOTE  Hospital Day(s): 6.   Interval History:  Patient seen and examined No acute issues overnight  Patient reports she is feeling a lot better Breathing is improving Abdomen is sore, no fever, chills, nausea, emesis She is without leukocytosis; WBC 7.3K Hgb to 10.7 Renal function normal; sCr - 0.50; UO - unmeasured No electrolyte derangments  She is on CLD She is passing flatus; + BM  Vital signs in last 24 hours: [min-max] current  Temp:  [97.5 F (36.4 C)-98.5 F (36.9 C)] 98 F (36.7 C) (10/29 0411) Pulse Rate:  [83-91] 83 (10/29 0411) Resp:  [16-18] 17 (10/29 0411) BP: (105-142)/(55-86) 105/55 (10/29 0411) SpO2:  [98 %-100 %] 98 % (10/29 0411) Weight:  [52.5 kg] 52.5 kg (10/29 0500)     Height: 5' 5 (165.1 cm) Weight: 52.5 kg BMI (Calculated): 19.26   Intake/Output last 2 shifts:  10/28 0701 - 10/29 0700 In: 335 [I.V.:335] Out: -    Physical Exam:  Constitutional: alert, cooperative and no distress  Respiratory: With congested upper airway sounds, on [place], no respiratory distress Cardiovascular: regular rate and sinus rhythm  Gastrointestinal: Soft, she does not appear tender, non-distended, no rebound/guarding  Integumentary: Laparotomy is CDI with staple, no erythema   Labs:     Latest Ref Rng & Units 09/22/2024    3:30 AM 09/21/2024    5:50 AM 09/20/2024    5:31 AM  CBC  WBC 4.0 - 10.5 K/uL 7.3  9.1  8.1   Hemoglobin 12.0 - 15.0 g/dL 89.2  88.1  88.2   Hematocrit 36.0 - 46.0 % 34.6  36.9  37.6   Platelets 150 - 400 K/uL 337  297  332       Latest Ref Rng & Units 09/22/2024    3:30 AM 09/21/2024    5:50 AM 09/20/2024    5:31 AM  CMP  Glucose 70 - 99 mg/dL 866  833  84   BUN 8 - 23 mg/dL 12  6  7    Creatinine 0.44 - 1.00 mg/dL 9.49  9.67  9.50   Sodium 135 - 145 mmol/L 139  135  136   Potassium 3.5 - 5.1 mmol/L 4.2  3.6  3.0   Chloride 98 - 111 mmol/L 100  100  102   CO2 22 - 32 mmol/L 28  25  25     Calcium 8.9 - 10.3 mg/dL 8.6  8.1  8.4      Imaging studies: No new pertinent imaging studies   Assessment/Plan:  73 y.o. 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 If she does well, we can consider soft diet for dinner vs breakfast tomorrow.   - No need for emergent surgical intervention   - Monitor abdominal examination; on-going bowel function  - Pain control prn; antiemetics prn   - Mobilize  - Will remove staples prior to DC   - Discharge Planning; She is doing better with ROBF, diet slowly advancing. Hopefully home in 24-48 hours.   All of the above findings and recommendations were discussed with the patient, and the medical team, and all of patient's questions were answered to her expressed satisfaction.  -- Arthea Platt, PA-C  [facility] 09/22/2024, 8:07 AM M-F: 7am - 4pm

## 2024-09-22 NOTE — Plan of Care (Signed)

## 2024-09-22 NOTE — Progress Notes (Signed)
 Physical Therapy Treatment Patient Details Name: Monique [name] MRN: [id number] DOB: 05-Aug-1951 Today's Date: 09/22/2024   History of Present Illness 73 y/o female presented to ED on 09/16/24 for possible dehydration. Admitted for post operative ileus following R hemicolectomy. Recent hospitalization 10/10-10/20/25 for colon cancer surgery. PMH: iron deficiency anemia, DVT, restless leg syndrome, MDD, colon cancer    PT Comments  Pt is alert but only truly oriented x 2. Does consistently follow commands and remains cooperative throughout. Likes to dictate session progression and was completely unwilling to use AD or even trial one. Pt requires no physical; assistance to exit bed, stand or ambulate > 200 ft. Occasional drifting in hallway but no intervention required. Encouraged pt to trial stairs to simulate home entry however pt unwilling. At conclusion of session, pt was seated in recliner with call bell in reach and chair alarm in place.    If plan is discharge home, recommend the following: A little help with walking and/or transfers;Help with stairs or ramp for entrance     Equipment Recommendations  None recommended by PT       Precautions / Restrictions Precautions Precautions: Fall Recall of Precautions/Restrictions: Impaired Restrictions Weight Bearing Restrictions Per Provider Order: No     Mobility  Bed Mobility Overal bed mobility: Modified Independent  General bed mobility comments: no physical assistance required to exit R side of bed.    Transfers Overall transfer level: Needs assistance Equipment used: None Transfers: Sit to/from Stand Sit to Stand: Supervision  General transfer comment: no physical assistance to stand or sit without use fo AD    Ambulation/Gait Ambulation/Gait assistance: Supervision Gait Distance (Feet): 200 Feet Assistive device: None Gait Pattern/deviations: Step-through pattern, Decreased stride length, Drifts right/left Gait  velocity: WNL  General Gait Details: Pt tolerated ambulation well. Encouraged to consider use of SPC in future for additional gaitr safety however pt unwilling to trial.   Stairs Stairs:  (Pt was encouraged to attempt stairs however was unwilling. I can do my stairs at home easily, they are only a few inches tall.)       Balance Overall balance assessment: Modified Independent     Communication Communication Communication: No apparent difficulties  Cognition Arousal: Alert Behavior During Therapy: Flat affect, Anxious   PT - Cognitive impairments: Safety/Judgement, Awareness, No family/caregiver present to determine baseline      PT - Cognition Comments: Pt is A and agreeable. Only truely oriented x 2. Does have some congitive deficits come to light during session however pt's cognition did not impact sesssion progression. Following commands: Intact      Cueing Cueing Techniques: Verbal cues         Pertinent Vitals/Pain Pain Assessment Pain Assessment: No/denies pain     PT Goals (current goals can now be found in the care plan section) Acute Rehab PT Goals Patient Stated Goal: none stated Progress towards PT goals: Progressing toward goals    Frequency    Min 1X/week       AM-PAC PT 6 Clicks Mobility   Outcome Measure  Help needed turning from your back to your side while in a flat bed without using bedrails?: None Help needed moving from lying on your back to sitting on the side of a flat bed without using bedrails?: None Help needed moving to and from a bed to a chair (including a wheelchair)?: None Help needed standing up from a chair using your arms (e.g., wheelchair or bedside chair)?: None Help needed to walk in hospital  room?: A Little Help needed climbing 3-5 steps with a railing? : A Little 6 Click Score: 22    End of Session   Activity Tolerance: Patient tolerated treatment well Patient left: in chair;with chair alarm set;with call  bell/phone within reach Nurse Communication: Mobility status PT Visit Diagnosis: Unsteadiness on feet (R26.81)     Time: 9068-9058 PT Time Calculation (min) (ACUTE ONLY): 10 min  Charges:    $Gait Training: 8-22 mins PT General Charges $$ ACUTE PT VISIT: 1 Visit                     Rankin Essex PTA 09/22/24, 9:53 AM

## 2024-09-22 NOTE — Progress Notes (Signed)
 Mobility Specialist Progress Note:    09/22/24 0810  Mobility  Activity Ambulated with assistance  Level of Assistance Standby assist, set-up cues, supervision of patient - no hands on  Assistive Device None  Distance Ambulated (ft) 300 ft  Range of Motion/Exercises Active;All extremities  Activity Response Tolerated well  Mobility visit 1 Mobility  Mobility Specialist Start Time (ACUTE ONLY) [id number]  Mobility Specialist Stop Time (ACUTE ONLY) 0806  Mobility Specialist Time Calculation (min) (ACUTE ONLY) 14 min   Pt received in bed, eager for mobility. Required SBA/CGA to stand and ambulate with no AD. Tolerated well, SpO2 90-92% on RA throughout session. Placed pt back on 1L via [place] per RN. Returned pt semi supine, all needs met.  Sherrilee Ditty Mobility Specialist Please contact via Special Educational Needs Teacher or  Rehab office at [phone]

## 2024-09-23 ENCOUNTER — Other Ambulatory Visit (HOSPITAL_COMMUNITY): Payer: Self-pay

## 2024-09-23 LAB — CBC
HCT: 37.4 % (ref 36.0–46.0)
Hemoglobin: 11.7 g/dL — ABNORMAL LOW (ref 12.0–15.0)
MCH: 27.1 pg (ref 26.0–34.0)
MCHC: 31.3 g/dL (ref 30.0–36.0)
MCV: 86.8 fL (ref 80.0–100.0)
Platelets: 402 K/uL — ABNORMAL HIGH (ref 150–400)
RBC: 4.31 MIL/uL (ref 3.87–5.11)
RDW: 13.8 % (ref 11.5–15.5)
WBC: 9.9 K/uL (ref 4.0–10.5)
nRBC: 0 % (ref 0.0–0.2)

## 2024-09-23 LAB — BASIC METABOLIC PANEL WITH GFR
Anion gap: 8 (ref 5–15)
BUN: 17 mg/dL (ref 8–23)
CO2: 28 mmol/L (ref 22–32)
Calcium: 8.8 mg/dL — ABNORMAL LOW (ref 8.9–10.3)
Chloride: 104 mmol/L (ref 98–111)
Creatinine, Ser: 0.72 mg/dL (ref 0.44–1.00)
GFR, Estimated: >60 mL/min (ref >60)
Glucose, Bld: 100 mg/dL — ABNORMAL HIGH (ref 70–99)
Potassium: 3.6 mmol/L (ref 3.5–5.1)
Sodium: 140 mmol/L (ref 135–145)

## 2024-09-23 LAB — PHOSPHORUS: Phosphorus: 4.2 mg/dL (ref 2.5–4.6)

## 2024-09-23 LAB — MAGNESIUM: Magnesium: 2.1 mg/dL (ref 1.7–2.4)

## 2024-09-23 MED ORDER — POLYETHYLENE GLYCOL 3350 17 G PO PACK: 17.0000 g | PACK | Freq: Every day | ORAL | 0 refills | Status: AC

## 2024-09-23 MED ORDER — ONDANSETRON 4 MG PO TBDP: 4.0000 mg | ORAL_TABLET | Freq: Three times a day (TID) | ORAL | 0 refills | Status: DC | PRN

## 2024-09-23 MED ORDER — PREDNISONE 20 MG PO TABS: 40.0000 mg | ORAL_TABLET | Freq: Every day | ORAL | 0 refills | Status: AC

## 2024-09-23 MED ORDER — DOCUSATE SODIUM 100 MG PO CAPS: 100.0000 mg | ORAL_CAPSULE | Freq: Two times a day (BID) | ORAL | 0 refills | Status: DC | PRN

## 2024-09-23 NOTE — Discharge Summary (Addendum)
 Patient ID: Mane Consolo MRN: [id number] DOB/AGE: [birth year] 73 y.o.  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)   Acute dyspnea   History of paroxysmal atrial fibrillation (HCC)   Malnutrition of moderate degree   Procedures:None  Hospital Course:   The patient is a 73 year old   Consults: Hospitalist for stridor  Disposition: Discharge disposition: 01-Home or Self Care       Discharge Instructions     Call MD for:  difficulty breathing, headache or visual disturbances   Complete by: As directed    Call MD for:  extreme fatigue   Complete by: As directed    Call MD for:  hives   Complete by: As directed    Call MD for:  persistant dizziness or light-headedness   Complete by: As directed    Call MD for:  persistant nausea and vomiting   Complete by: As directed    Call MD for:  redness, tenderness, or signs of infection (pain, swelling, redness, odor or green/yellow discharge around incision site)   Complete by: As directed    Call MD for:  severe uncontrolled pain   Complete by: As directed    Call MD for:  temperature >100.4   Complete by: As directed    Diet - low sodium heart healthy   Complete by: As directed    Increase activity slowly   Complete by: As directed       Allergies as of 09/23/2024       Reactions   Penicillins    bleeding        Medication List     TAKE these medications    dabigatran 150 MG Caps capsule Commonly known as: PRADAXA Take 1 capsule (150 mg total) by mouth 2 (two) times daily. Patient does not have insurance- please use GoodRx: BIN [id number], PCN GDC, ID [id number], Grp GDRX   docusate sodium 100 MG capsule Commonly known as: COLACE Take 1 capsule (100 mg total) by mouth 2 (two) times daily  as needed for mild constipation.   escitalopram  5 MG tablet Commonly known as: Lexapro  Take 1 tablet (5 mg total) by mouth daily.   ondansetron 4 MG disintegrating tablet Commonly known as: ZOFRAN-ODT Take 1 tablet (4 mg total) by mouth every 8 (eight) hours as needed for nausea or vomiting.   oxyCODONE 5 MG immediate release tablet Commonly known as: Oxy IR/ROXICODONE Take 1 tablet (5 mg total) by mouth every 6 (six) hours as needed for moderate pain (pain score 4-6) or severe pain (pain score 7-10).   polyethylene glycol 17 g packet Commonly known as: MIRALAX / GLYCOLAX Take 17 g by mouth daily. Start taking on: September 24, 2024   predniSONE 20 MG tablet Commonly known as: DELTASONE Take 2 tablets (40 mg total) by mouth daily with breakfast for 3 doses. Start taking on: September 24, 2024   PRENATAL/FOLIC ACID+DHA PO Take 1 tablet by mouth daily.   rOPINIRole 0.25 MG tablet Commonly known as: REQUIP Take 0.25 mg by mouth at bedtime.   topiramate 50 MG tablet Commonly known as: TOPAMAX Take 1 tablet by mouth daily.          Jayson Endow, M.D.

## 2024-09-23 NOTE — Progress Notes (Signed)
 [facility] PRO[facility]ESS [facility]  Hospital Day(s): 7.   Interval History:  Patient seen and examined No acute issues overnight  Patient reports she had a rough evening with gas pains but thiese seem to have resolved Breathing is improving No  fever, chills, nausea, emesis She is without leukocytosis; WBC 9.9K Hgb to 11.7; this is improved Renal function normal; sCr - 0.72; UO - unmeasured No electrolyte derangments  She is on FLD She is passing flatus; + BM this AM  Vital signs in last 24 hours: [min-max] current  Temp:  [98.1 F (36.7 C)-99.2 F (37.3 C)] 98.1 F (36.7 C) (10/30 0742) Pulse Rate:  [96-104] 97 (10/30 0742) Resp:  [16-18] 16 (10/30 0742) BP: (110-141)/(54-74) 110/54 (10/30 0742) SpO2:  [92 %-95 %] 92 % (10/30 0742) Weight:  [62.1 kg] 62.1 kg (10/30 0500)     Height: 5' 5 (165.1 cm) Weight: 62.1 kg BMI (Calculated): 22.78   Intake/Output last 2 shifts:  10/29 0701 - 10/30 0700 In: 960 [P.O.:960] Out: -    Physical Exam:  Constitutional: alert, cooperative and no distress  Respiratory: Breathing markedly improved; no respiratory distress Cardiovascular: regular rate and sinus rhythm  Gastrointestinal: Soft, she does not appear tender, non-distended, no rebound/guarding  Integumentary: Laparotomy is CDI with staples (removed), no erythema   Labs:     Latest Ref Rng & Units 09/23/2024    4:38 AM 09/22/2024    3:30 AM 09/21/2024    5:50 AM  CBC  WBC 4.0 - 10.5 K/uL 9.9  7.3  9.1   Hemoglobin 12.0 - 15.0 g/dL 88.2  89.2  88.1   Hematocrit 36.0 - 46.0 % 37.4  34.6  36.9   Platelets 150 - 400 K/uL 402  337  297       Latest Ref Rng & Units 09/23/2024    4:38 AM 09/22/2024    3:30 AM 09/21/2024    5:50 AM  CMP  Glucose 70 - 99 mg/dL 899  866  833   BUN 8 - 23 mg/dL 17  12  6    Creatinine 0.44 - 1.00 mg/dL 9.27  9.49  9.67   Sodium 135 - 145 mmol/L 140  139  135   Potassium 3.5 - 5.1 mmol/L 3.6  4.2  3.6   Chloride 98 - 111  mmol/L 104  100  100   CO2 22 - 32 mmol/L 28  28  25    Calcium 8.9 - 10.3 mg/dL 8.8  8.6  8.1      Imaging studies: No new pertinent imaging studies   Assessment/Plan:  73 y.o. 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 I think her biggest barrier is anxiety regarding bowel process. I do think if she does well today with diet she can go home this evening. I have no problem with staying until tomorrow (10/31) as well if needed. We will reassess this afternoon.   All of the above findings and recommendations were discussed with the patient, and the medical team, and all of patient's questions were answered to her expressed satisfaction.  -- Dravon Nott, PA-C   [facility] 09/23/2024, 8:24 AM M-F: 7am - 4pm

## 2024-09-23 NOTE — TOC CM/SW Note (Addendum)
 Transition of Care [facility]) - Inpatient Brief Assessment   Patient Details  Name: Monique [name] MRN: [id number] Date of Birth: 1951-01-11  Transition of Care [facility]) CM/SW Contact:    Daved JONETTA Hamilton, RN Phone Number: 09/23/2024, 9:16 AM   Clinical Narrative:   Transition of Care [facility]) Screening Note   Patient Details  Name: Monique [name] Date of Birth: [birth year]   Transition of Care [facility]) CM/SW Contact:    Daved JONETTA Hamilton, RN Phone Number: 09/23/2024, 9:16 AM    Transition of Care Department [facility]) has reviewed patient and no TOC needs have been identified at this time. If new patient transition needs arise, please place a TOC consult.   Update: High Risk Assessment complete  Transition of Care Asessment: Insurance and Status: Insurance coverage has been reviewed Patient has primary care physician: Yes   Prior level of function:: Independent Prior/Current Home Services: No current home services (patient discharged on 10/20 with HH, unclear if patient has started therapy) Social Drivers of Health Review: SDOH reviewed no interventions necessary Readmission risk has been reviewed: Yes (will complete high risk assessment) Transition of care needs: no transition of care needs at this time

## 2024-09-30 ENCOUNTER — Encounter: Payer: Self-pay | Admitting: General Surgery

## 2024-09-30 ENCOUNTER — Ambulatory Visit (INDEPENDENT_AMBULATORY_CARE_PROVIDER_SITE_OTHER): Payer: Self-pay | Admitting: General Surgery

## 2024-09-30 VITALS — BP 109/75 | HR 96 | Temp 98.7°F | Ht 65.0 in | Wt 101.0 lb

## 2024-09-30 DIAGNOSIS — C180 Malignant neoplasm of cecum: Secondary | ICD-10-CM

## 2024-09-30 DIAGNOSIS — Z08 Encounter for follow-up examination after completed treatment for malignant neoplasm: Secondary | ICD-10-CM

## 2024-09-30 DIAGNOSIS — D490 Neoplasm of unspecified behavior of digestive system: Secondary | ICD-10-CM

## 2024-09-30 DIAGNOSIS — C189 Malignant neoplasm of colon, unspecified: Secondary | ICD-10-CM

## 2024-09-30 NOTE — Patient Instructions (Addendum)
 We will see you in 6 months, our schedule is not yet available , so we placed you in our recall system and will send you out a letter with appointment information. We also sent a referral to Dr Jacobo, their office will give you a call to schedule an appointment. If you have any questions or concerns please give our office a call    GENERAL POST-OPERATIVE PATIENT INSTRUCTIONS   WOUND CARE INSTRUCTIONS:  Keep a dry clean dressing on the wound if there is drainage. The initial bandage may be removed after 24 hours.  Once the wound has quit draining you may leave it open to air.  If clothing rubs against the wound or causes irritation and the wound is not draining you may cover it with a dry dressing during the daytime.  Try to keep the wound dry and avoid ointments on the wound unless directed to do so.  If the wound becomes bright red and painful or starts to drain infected material that is not clear, please contact your physician immediately.  If the wound is mildly pink and has a thick firm ridge underneath it, this is normal, and is referred to as a healing ridge.  This will resolve over the next 4-6 weeks.  BATHING: You may shower if you have been informed of this by your surgeon. However, Please do not submerge in a tub, hot tub, or pool until incisions are completely sealed or have been told by your surgeon that you may do so.  DIET:  You may eat any foods that you can tolerate.  It is a good idea to eat a high fiber diet and take in plenty of fluids to prevent constipation.  If you do become constipated you may want to take a mild laxative or take ducolax tablets on a daily basis until your bowel habits are regular.  Constipation can be very uncomfortable, along with straining, after recent surgery.  ACTIVITY:  You are encouraged to cough and deep breath or use your incentive spirometer if you were given one, every 15-30 minutes when awake.  This will help prevent respiratory complications and  low grade fevers post-operatively if you had a general anesthetic.  You may want to hug a pillow when coughing and sneezing to add additional support to the surgical area, if you had abdominal or chest surgery, which will decrease pain during these times.  You are encouraged to walk and engage in light activity for the next two weeks.  You should not lift more than 20 pounds for 6 weeks total after surgery as it could put you at increased risk for complications.  Twenty pounds is roughly equivalent to a plastic bag of groceries. At that time- Listen to your body when lifting, if you have pain when lifting, stop and then try again in a few days. Soreness after doing exercises or activities of daily living is normal as you get back in to your normal routine.  MEDICATIONS:  Try to take narcotic medications and anti-inflammatory medications, such as tylenol, ibuprofen, naprosyn, etc., with food.  This will minimize stomach upset from the medication.  Should you develop nausea and vomiting from the pain medication, or develop a rash, please discontinue the medication and contact your physician.  You should not drive, make important decisions, or operate machinery when taking narcotic pain medication.  SUNBLOCK Use sun block to incision area over the next year if this area will be exposed to sun. This helps decrease scarring  and will allow you avoid a permanent darkened area over your incision.  QUESTIONS:  Please feel free to call our office if you have any questions, and we will be glad to assist you. [phone]

## 2024-10-01 ENCOUNTER — Ambulatory Visit (INDEPENDENT_AMBULATORY_CARE_PROVIDER_SITE_OTHER): Payer: Self-pay | Admitting: Pediatrics

## 2024-10-01 ENCOUNTER — Encounter: Payer: Self-pay | Admitting: Pediatrics

## 2024-10-01 VITALS — BP 101/68 | HR 96 | Ht 65.0 in | Wt 103.2 lb

## 2024-10-01 DIAGNOSIS — K5900 Constipation, unspecified: Secondary | ICD-10-CM

## 2024-10-01 DIAGNOSIS — G2581 Restless legs syndrome: Secondary | ICD-10-CM

## 2024-10-01 DIAGNOSIS — Z681 Body mass index (BMI) 19 or less, adult: Secondary | ICD-10-CM

## 2024-10-01 DIAGNOSIS — I82531 Chronic embolism and thrombosis of right popliteal vein: Secondary | ICD-10-CM

## 2024-10-01 DIAGNOSIS — I480 Paroxysmal atrial fibrillation: Secondary | ICD-10-CM

## 2024-10-01 DIAGNOSIS — J449 Chronic obstructive pulmonary disease, unspecified: Secondary | ICD-10-CM

## 2024-10-01 DIAGNOSIS — Z09 Encounter for follow-up examination after completed treatment for conditions other than malignant neoplasm: Secondary | ICD-10-CM

## 2024-10-01 DIAGNOSIS — F4323 Adjustment disorder with mixed anxiety and depressed mood: Secondary | ICD-10-CM

## 2024-10-01 MED ORDER — DOCUSATE SODIUM 100 MG PO CAPS: 100.0000 mg | ORAL_CAPSULE | Freq: Two times a day (BID) | ORAL | 2 refills | Status: AC | PRN

## 2024-10-01 NOTE — Progress Notes (Signed)
 Office Visit  BP 101/68   Pulse 96   Ht 5' 5 (1.651 m)   Wt 103 lb 3.2 oz (46.8 kg)   LMP  (LMP Unknown)   SpO2 97%   BMI 17.17 kg/m    Subjective:    Patient ID: Monique [name], female    DOB: 12/17/1950, 73 y.o.   MRN: [id number]  HPI: Monique [name] is a 73 y.o. female  Chief Complaint  Patient presents with   Hospitalization Follow-up   Summary of Hospitalization Dates of Hospitalization : 09/03/24 until 09/13/24. She was then readmitted 09/16/24-09/23/24 for posop ileus.  Chief reason for hospitalization : acute GI bleed with secondary to fecal mass Secondary problems :  MDD DVT RLS Follow up recommendations : Follow-up with general surgery as scheduled. Follow-up with oncology in 1 week.   Brief summary :  Pt was hospitalized initially for acute GI bleed. She was found to have colonic mass. Discharged and the readmitted for post op ileus.  For further details, please see D/C summary dated : 09/03/2024 and 09/16/2024  Interval History Today, the patient reports she has been feeling well. She has increased the amount of food she is eating slowly with small meals Overall BM have been good with occasional constipation, requesting colace refills Minimal abdominal discomfort, no  nausea, vomiting.  She was started on ropinirole 0.25mg  by neurology which has resolved her RSL symptoms and has been able to rest well. Mood stable, overall coping well.   Relevant past medical, surgical, family and social history reviewed and updated as indicated. Interim medical history since our last visit reviewed. Allergies and medications reviewed and updated.  ROS per HPI unless specifically indicated above     Objective:    BP 101/68   Pulse 96   Ht 5' 5 (1.651 m)   Wt 103 lb 3.2 oz (46.8 kg)   LMP  (LMP Unknown)   SpO2 97%   BMI 17.17 kg/m   Wt Readings from Last 3 Encounters:  10/01/24 103 lb 3.2 oz (46.8 kg)  09/30/24 101 lb (45.8 kg)  09/23/24 136 lb 14.5 oz  (62.1 kg)     Physical Exam Constitutional:      Appearance: Normal appearance.  HENT:     Head: Normocephalic and atraumatic.  Eyes:     Pupils: Pupils are equal, round, and reactive to light.  Cardiovascular:     Rate and Rhythm: Normal rate and regular rhythm.     Pulses: Normal pulses.     Heart sounds: Normal heart sounds.  Pulmonary:     Effort: Pulmonary effort is normal.     Breath sounds: Normal breath sounds.  Abdominal:     General: Abdomen is flat.     Palpations: Abdomen is soft.     Comments: Abdomen with dressing in place, C/D/I  Musculoskeletal:        General: Normal range of motion.     Cervical back: Normal range of motion.  Skin:    General: Skin is warm and dry.     Capillary Refill: Capillary refill takes less than 2 seconds.  Neurological:     General: No focal deficit present.     Mental Status: She is alert. Mental status is at baseline.  Psychiatric:        Mood and Affect: Mood normal.        Behavior: Behavior normal.         10/01/2024    1:29 PM 08/09/2024  3:06 PM 08/09/2024    3:03 PM 07/05/2024    1:59 PM 07/02/2024   10:51 AM  Depression screen PHQ 2/9  Decreased Interest 3 0 0 3 3  Down, Depressed, Hopeless 3 0 0 2 3  PHQ - 2 Score 6 0 0 5 6  Altered sleeping 3   3 3   Tired, decreased energy 3   2 3   Change in appetite 3   3 1   Feeling bad or failure about yourself  0   0 0  Trouble concentrating 3   3 3   Moving slowly or fidgety/restless 0   1 1  Suicidal thoughts 1   0 0  PHQ-9 Score 19   17  17    Difficult doing work/chores    Extremely dIfficult Extremely dIfficult     Data saved with a previous flowsheet row definition       10/01/2024    1:30 PM 07/05/2024    1:59 PM 07/02/2024   10:51 AM 05/12/2024    2:47 PM  GAD 7 : Generalized Anxiety Score  Nervous, Anxious, on Edge  3 3 2   Control/stop worrying  3 3 1   Worry too much - different things 1 3 3  0  Trouble relaxing  3 3 1   Restless 0 2 2 1   Easily annoyed or  irritable 1 2 3 1   Afraid - awful might happen 0 1 1 0  Total GAD 7 Score  17 18 6   Anxiety Difficulty  Very difficult Extremely difficult Somewhat difficult       Assessment & Plan:  Assessment & Plan   Hospital discharge follow-up Hospitalization reviewed, medications reviewed. Plan and follow up for acute concerns below.  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 Diagnosed earlier this year with malignancy found during recent admission. Suspect this caused hypercoagulable state. Will continue to refill until further recommendations from heme/onc regarding long term use.   Chronic obstructive pulmonary disease, unspecified COPD type [facility]) Assessment & Plan: Hospitalized in 2013 for COPD exacerbation. She was suspected to have COPD exacerbation during most recent admission, no ongoing SOB reported today. If she were to have recurrent symptoms, consider PFTs and starting recue and daily inhaler. Limited by cost of medications/insurance.   History of paroxysmal atrial fibrillation [facility]) Assessment & Plan: Found in 2013 hospitalization, returned to sinus rhythm. CTM symptoms.   Restless leg syndrome Assessment & Plan: Long standing issue thought to be exacerbated by her iron deficiency which in theory should improve now after GI mass removed. She was also started on ropinirole by neurology. She reports this is working well to manage her symptoms and has improved her sleep quality significantly. CTM.    Adjustment disorder with mixed anxiety and depressed mood Assessment & Plan: Recent issue in setting of poor sleep and health related issues. She is doing well on lexapro  5mg . Consider discontinuing given resolutions of many of her presenting symptoms at the start of this medication initiation. See PHQ9 as above.   Constipation, unspecified constipation type Encouraged ongoing miralax. Can use senna to  supplement. Encouraged to stay active and hydrated as able. Passing gas. -     Docusate Sodium; Take 1 capsule (100 mg total) by mouth 2 (two) times daily as needed for mild constipation.  Dispense: 30 capsule; Refill: 2  BMI 17 In setting of admission above. She is getting appetite back slowly. CTM.  Follow up plan: Return  in about 3 months (around 01/01/2025).  Hadassah SHAUNNA Nett, MD

## 2024-10-04 ENCOUNTER — Inpatient Hospital Stay: Payer: Self-pay | Admitting: Oncology

## 2024-10-06 ENCOUNTER — Encounter: Payer: Self-pay | Admitting: Pediatrics

## 2024-10-06 ENCOUNTER — Telehealth: Payer: Self-pay

## 2024-10-06 ENCOUNTER — Telehealth: Payer: Self-pay | Admitting: Pediatrics

## 2024-10-06 ENCOUNTER — Telehealth: Payer: Self-pay | Admitting: General Surgery

## 2024-10-06 MED ORDER — DABIGATRAN ETEXILATE MESYLATE 150 MG PO CAPS
150.0000 mg | ORAL_CAPSULE | Freq: Two times a day (BID) | ORAL | 0 refills | Status: AC
Start: 2024-10-06 — End: ?

## 2024-10-06 NOTE — Assessment & Plan Note (Signed)
 Found in 2013 hospitalization, returned to sinus rhythm. CTM symptoms.

## 2024-10-06 NOTE — Assessment & Plan Note (Signed)
 Recent issue in setting of poor sleep and health related issues. She is doing well on lexapro  5mg . Consider discontinuing given resolutions of many of her presenting symptoms at the start of this medication initiation. See PHQ9 as above.

## 2024-10-06 NOTE — Assessment & Plan Note (Deleted)
 Hospitalized in 2013 for COPD exacerbation. She was suspected to have COPD exacerbation during most recent admission, no ongoing SOB reported today. If she were to have recurrent symptoms, consider PFTs and starting recue and daily inhaler. Limited by cost of medications/insurance.

## 2024-10-06 NOTE — Telephone Encounter (Signed)
 Pt said that she takes the stool softner 2x a day is eating more that is coming out. She said that she has had some small ones but not a lot.What else can she do. She has not taken any Mirlox. Is going to try just 1/2 cup of that but wants to know if anything else she may do.

## 2024-10-06 NOTE — Progress Notes (Signed)
   10/06/2024  Patient ID: Monique [name], female   DOB: 03-Dec-1950, 73 y.o.   MRN: [id number]  Patient outreach to inform [name] that a 1 month refill was sent in for Dabigatran 150mg  BID.  Patient states she received notification from [facility] stating there was an issue with the medication.  Contacted the pharmacy, and this is going through on GoodRx coupon, is in stock, and will be ready for pick up later today.  Informed patient a 1 month supply was sent in based on upcoming visit with Dr. Jacobo.  Primary care is okay to continue prescribing and managing but would like input from Dr. Jacobo in regard to duration.  Patient did not wish to discuss upcoming visit with Dr. Jacobo and concluded our phone call before I could explain that duration of dabigatran may look different since blood clot was not provoked.  I can review visit notes and follow-up with Dr. Jacobo if needed in regard to dabigatran plan.  [name], PharmD, DPLA

## 2024-10-06 NOTE — Telephone Encounter (Signed)
 No assistance needed. Patient was made aware after call with PharmD that rx is ready to be picked up at her pharmacy.

## 2024-10-06 NOTE — Assessment & Plan Note (Signed)
 Hospitalized in 2013 for COPD exacerbation. She was suspected to have COPD exacerbation during most recent admission, no ongoing SOB reported today. If she were to have recurrent symptoms, consider PFTs and starting recue and daily inhaler. Limited by cost of medications/insurance.

## 2024-10-06 NOTE — Assessment & Plan Note (Signed)
 On pradaxa. Diagnosed earlier this year with malignancy found during recent admission. Suspect this caused hypercoagulable state. Will continue to refill until further recommendations from heme/onc regarding long term use.

## 2024-10-06 NOTE — Assessment & Plan Note (Signed)
 Long standing issue thought to be exacerbated by her iron deficiency which in theory should improve now after GI mass removed. She was also started on ropinirole by neurology. She reports this is working well to manage her symptoms and has improved her sleep quality significantly. CTM.

## 2024-10-06 NOTE — Progress Notes (Signed)
   10/06/2024  Patient ID: Monique [name], female   DOB: 04/30/1951, 73 y.o.   MRN: [id number]  Missed call/voicemail from patient stating she is down to 5 days of dabigatran 150mg  BID remaining and is requesting a refill.  Order pending for a 1 month supply for Dr. Herold to sign if in agreement.  Patient appears to have canceled her appointment 11/10 to follow-up with Dr. Zelphia; but she is scheduled to see Dr. Jacobo with oncology 11/21.  She will see Jolene Cannady, NP, at [facility] in March since Dr. Herold will no longer be with the practice.  I will consult PCP to see who will be managing dabigatran therapy moving forward and notify the patient.    [name], PharmD, DPLA

## 2024-10-06 NOTE — Telephone Encounter (Signed)
 Copied from CRM [id number]. Topic: Clinical - Medication Prior Auth >> Oct 06, 2024  3:51 PM Shanda MATSU wrote: Reason for CRM: Patient calling in regards to call she recvd from pharmacy about an approval being needed in order for prescription to be filled, patient thinks this is needed for med, Dabigatran 150mg .

## 2024-10-12 NOTE — Progress Notes (Signed)
 Outpatient Surgical Follow Up   Monique [name] is an 73 y.o. female.   Chief Complaint  Patient presents with   Routine Post Op    HPI: The patient returns today status post open right ectomy for bleeding mass.  She was readmitted to the hospital for an ileus.  She reports that today she had a normal bowel movement.  She is not tolerating a diet.  She continues to have some small amount of pain but is overall doing well.  She did have positive lymph nodes on her pathology but would like to see a new provider for her oncologist.  She is unsure whether she wants to proceed with chemotherapy.  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    Past Surgical History:  Procedure Laterality Date   BREAST LUMPECTOMY     Rt breast   COLONOSCOPY N/A 09/06/2024   Procedure: COLONOSCOPY;  Surgeon: Jinny Carmine, MD;  Location: [facility] ENDOSCOPY;  Service: Endoscopy;  Laterality: N/A;   ESOPHAGOGASTRODUODENOSCOPY N/A 09/06/2024   Procedure: EGD (ESOPHAGOGASTRODUODENOSCOPY);  Surgeon: Jinny Carmine, MD;  Location: [facility] ENDOSCOPY;  Service: Endoscopy;  Laterality: N/A;   PARTIAL COLECTOMY N/A 09/08/2024   Procedure: COLECTOMY, PARTIAL;  Surgeon: Marinda Jayson KIDD, MD;  Location: [facility] ORS;  Service: General;  Laterality: N/A;   TUBAL LIGATION  1980s   Didn't want any more kids    Family History  Problem Relation Age of Onset   Emphysema Maternal Grandfather        smoked a pipe   COPD Maternal Grandfather    Depression Maternal Grandfather    Stomach cancer Maternal Aunt        Caused by exposure to xray as a technician before it was known of the harm that could be done.   Lung cancer Maternal Uncle         was a smoker   Miscarriages / Stillbirths Mother    Miscarriages / Stillbirths Father     (Hardening  of the arteries) Maternal Grandmother    Alcohol abuse Brother     Social History:  reports that she quit smoking about 12 years ago. Her smoking use included cigarettes. She started smoking about 54 years ago. She has a 33.6 pack-year smoking history. She has never used smokeless tobacco. She reports that she does not drink alcohol and does not use drugs.  Allergies:  Allergies  Allergen Reactions   Penicillins     bleeding    Medications reviewed.    ROS Full ROS performed and is otherwise negative other than what is stated in HPI   BP 109/75   Pulse 96   Temp 98.7 F (37.1 C) (Oral)   Ht 5' 5 (1.651 m)   Wt 101 lb (45.8 kg)   LMP  (LMP Unknown)   SpO2 97%   BMI 16.81 kg/m   Physical Exam Abdomen is soft, some mild tenderness, Steri-Strips in place over incision  1. Colon, segmental resection for tumor, right :       INVASIVE ADENOCARCINOMA, MODERATELY DIFFERENTIATED.       TUMOR SITE: CECUM.  TUMOR SIZE: 4.5 CM IN MAXIMAL DIMENSION.       TUMOR INVADES PERICOLORECTAL SOFT TISSUE.       LYMPHOVASCULAR INVASION IS NOT IDENTIFIED.       PERINEURAL INVASION IS IDENTIFIED.       THIRTEE LYMPH NODES, POSITIVE FOR METASTATIC CARCINOMA (0/13).       ONE TUMOR DEPOSIT.       VERMIFORM APPENDIX NEGATIVE FOR CARCINOMA.       SEE ONCOLOGY TABLE.   Pathology listed below  Assessment/Plan:  1. Malignant neoplasm of colon, unspecified part of colon (HCC) (Primary) Patient with right colon cancer.  Status post resection.  She is doing well and having normal bowel movements now.  She is tolerating a diet.  We will refer her to oncology for discussion of need for chemotherapy - Ambulatory referral to Hematology / Oncology  2. Neoplasm of digestive system  - Ambulatory referral to Hematology / Oncology    Jayson Endow, M.D. [facility]

## 2024-10-14 ENCOUNTER — Other Ambulatory Visit: Payer: Self-pay

## 2024-10-14 DIAGNOSIS — Z86718 Personal history of other venous thrombosis and embolism: Secondary | ICD-10-CM

## 2024-10-14 DIAGNOSIS — Z7689 Persons encountering health services in other specified circumstances: Secondary | ICD-10-CM

## 2024-10-15 ENCOUNTER — Inpatient Hospital Stay: Payer: Self-pay | Attending: Oncology | Admitting: Oncology

## 2024-10-15 ENCOUNTER — Encounter: Payer: Self-pay | Admitting: Oncology

## 2024-10-15 ENCOUNTER — Inpatient Hospital Stay: Payer: Self-pay

## 2024-10-15 ENCOUNTER — Telehealth: Payer: Self-pay | Admitting: Oncology

## 2024-10-15 VITALS — BP 121/76 | HR 89 | Temp 97.7°F | Resp 18 | Ht 65.0 in | Wt 103.0 lb

## 2024-10-15 DIAGNOSIS — C189 Malignant neoplasm of colon, unspecified: Secondary | ICD-10-CM | POA: Insufficient documentation

## 2024-10-15 NOTE — Telephone Encounter (Signed)
 Called patient to come in for lab as soon as she can. He needed them today and didn't go. Also wanted to schedule the follow up in the wrap up for today from Dr. Jacobo.  No answer- left message.

## 2024-10-15 NOTE — Telephone Encounter (Signed)
 Pt called back and appts are scheduled

## 2024-10-15 NOTE — Progress Notes (Signed)
 Patient wants to know if she has to continue taking the same dose of Pradaxa .

## 2024-10-15 NOTE — Telephone Encounter (Signed)
 Pt left without getting labs and scheduled FU apts with Dr. Georgina. I called the pt no answer so I left a vm for the pt to return my call to get scheduled.

## 2024-10-15 NOTE — Progress Notes (Signed)
 No navigational needs at this time.

## 2024-10-15 NOTE — Progress Notes (Signed)
 [facility]  Telephone:([phone]) [phone] Fax:(336) [phone]  ID: Monique [name] OB: 04/25/1951  MR#: [id number]  RDW#:[id number]  Patient Care Team: Herold Hadassah SQUIBB, MD as PCP - General (Family Medicine) Maree Hue, MD as Referring Physician (Internal Medicine) Maurie Rayfield BIRCH, RN as Oncology Nurse Navigator  CHIEF COMPLAINT: Stage IIIb adenocarcinoma of the colon.  INTERVAL HISTORY: Patient is a 73 year old female previously seen by another provider for DVT who recently had a significant amount of diarrhea and blood in her stools.  Subsequent workup revealed large colon mass.  She underwent surgical resection revealing the above-stated malignancy.  She continues to have fatigue, but otherwise feels well.  She has no neurologic complaints.  She denies any recent fevers or illnesses.  She has a good appetite and denies weight loss.  She has no chest pain, shortness of breath, cough, or hemoptysis.  She denies any nausea, vomiting, constipation, or diarrhea.  She has no further melena or hematochezia.  She has no urinary complaints.  Patient offers no further specific complaints today.  REVIEW OF SYSTEMS:   Review of Systems  Constitutional: Negative.  Negative for fever, malaise/fatigue and weight loss.  Respiratory: Negative.  Negative for cough, hemoptysis and shortness of breath.   Cardiovascular: Negative.  Negative for chest pain and leg swelling.  Gastrointestinal: Negative.  Negative for abdominal pain.  Genitourinary: Negative.  Negative for dysuria and hematuria.  Musculoskeletal: Negative.  Negative for back pain.  Skin: Negative.  Negative for rash.  Neurological: Negative.  Negative for dizziness, focal weakness, weakness and headaches.  Psychiatric/Behavioral: Negative.  The patient is not nervous/anxious.     As per HPI. Otherwise, a complete review of systems is negative.  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    PAST SURGICAL HISTORY: Past Surgical History:  Procedure Laterality Date   BREAST LUMPECTOMY     Rt breast   COLONOSCOPY N/A 09/06/2024   Procedure: COLONOSCOPY;  Surgeon: Jinny Carmine, MD;  Location: [facility] ENDOSCOPY;  Service: Endoscopy;  Laterality: N/A;   ESOPHAGOGASTRODUODENOSCOPY N/A 09/06/2024   Procedure: EGD (ESOPHAGOGASTRODUODENOSCOPY);  Surgeon: Jinny Carmine, MD;  Location: [facility] ENDOSCOPY;  Service: Endoscopy;  Laterality: N/A;   PARTIAL COLECTOMY N/A 09/08/2024   Procedure: COLECTOMY, PARTIAL;  Surgeon: Marinda Jayson KIDD, MD;  Location: [facility] ORS;  Service: General;  Laterality: N/A;   TUBAL LIGATION  1980s   Didn't want any more kids    FAMILY HISTORY: Family History  Problem Relation Age of Onset   Emphysema Maternal Grandfather        smoked a pipe   COPD Maternal Grandfather    Depression Maternal Grandfather    Stomach cancer Maternal Aunt        Caused by exposure to xray as a technician before it was known of the harm that could be done.   Lung cancer Maternal Uncle        was a smoker   Miscarriages / Stillbirths Mother    Miscarriages / Stillbirths Father     (Hardening  of the arteries) Maternal Grandmother    Alcohol abuse Brother  ADVANCED DIRECTIVES (Y/N):  N  HEALTH MAINTENANCE: Social History   Tobacco Use   Smoking status: Former    Current packs/day: 0.00    Average packs/day: 0.8 packs/day for 42.0 years (33.6 ttl pk-yrs)    Types: Cigarettes    Start date: 12/01/1969    Quit date: 12/02/2011    Years since quitting: 12.8   Smokeless tobacco: Never  Substance Use Topics   Alcohol use: No   Drug use: No     Colonoscopy:  PAP:  Bone  density:  Lipid panel:  Allergies  Allergen Reactions   Penicillins     bleeding    Current Outpatient Medications  Medication Sig Dispense Refill   dabigatran  (PRADAXA ) 150 MG CAPS capsule Take 1 capsule (150 mg total) by mouth 2 (two) times daily. Patient does not have insurance- please use GoodRx: BIN [id number], PCN GDC, ID [id number], Grp GDRX 60 capsule 0   docusate sodium  (COLACE) 100 MG capsule Take 1 capsule (100 mg total) by mouth 2 (two) times daily as needed for mild constipation. 30 capsule 2   escitalopram  (LEXAPRO ) 5 MG tablet Take 1 tablet (5 mg total) by mouth daily. 30 tablet 6   polyethylene glycol (MIRALAX  / GLYCOLAX ) 17 g packet Take 17 g by mouth daily. 14 each 0   Prenatal MV-Min-Fe Fum-FA-DHA (PRENATAL/FOLIC ACID+DHA PO) Take 1 tablet by mouth daily.     rOPINIRole  (REQUIP ) 0.25 MG tablet Take 0.25 mg by mouth at bedtime.     topiramate  (TOPAMAX ) 50 MG tablet Take 1 tablet by mouth daily.     ondansetron  (ZOFRAN -ODT) 4 MG disintegrating tablet Take 1 tablet (4 mg total) by mouth every 8 (eight) hours as needed for nausea or vomiting. (Patient not taking: Reported on 10/15/2024) 20 tablet 0   oxyCODONE  (OXY IR/ROXICODONE ) 5 MG immediate release tablet Take 1 tablet (5 mg total) by mouth every 6 (six) hours as needed for moderate pain (pain score 4-6) or severe pain (pain score 7-10). (Patient not taking: Reported on 10/15/2024) 10 tablet 0   No current facility-administered medications for this visit.    OBJECTIVE: Vitals:   10/15/24 1050  BP: 121/76  Pulse: 89  Resp: 18  Temp: 97.7 F (36.5 C)  SpO2: 100%     Body mass index is 17.14 kg/m.    ECOG FS:0 - Asymptomatic  General: Well-developed, well-nourished, no acute distress. Eyes: Pink conjunctiva, anicteric sclera. HEENT: Normocephalic, moist mucous membranes. Lungs: No audible wheezing or coughing. Heart: Regular rate and rhythm. Abdomen: Soft, nontender, no obvious distention. Musculoskeletal: No  edema, cyanosis, or clubbing. Neuro: Alert, answering all questions appropriately. Cranial nerves grossly intact. Skin: No rashes or petechiae noted. Psych: Normal affect. Lymphatics: No cervical, calvicular, axillary or inguinal LAD.   LAB RESULTS:  Lab Results  Component Value Date   NA 140 09/23/2024   K 3.6 09/23/2024   CL 104 09/23/2024   CO2 28 09/23/2024   GLUCOSE 100 (H) 09/23/2024   BUN 17 09/23/2024   CREATININE 0.72 09/23/2024   CALCIUM 8.8 (L) 09/23/2024   PROT 6.5 09/16/2024   ALBUMIN 2.9 (L) 09/16/2024   AST 17 09/16/2024   ALT 14 09/16/2024   ALKPHOS 45 09/16/2024   BILITOT 0.7 09/16/2024   GFRNONAA >60 09/23/2024   GFRAA >60 12/07/2011    Lab Results  Component Value Date   WBC 9.9 09/23/2024   NEUTROABS 2.2 09/07/2024   HGB 11.7 (L) 09/23/2024   HCT 37.4 09/23/2024   MCV 86.8  09/23/2024   PLT 402 (H) 09/23/2024     STUDIES: DG Chest Port 1 View Result Date: 09/21/2024 EXAM: 1 VIEW XRAY OF THE CHEST 09/21/2024 12:33:00 AM COMPARISON: Chest x-ray 09/18/2024. Chest CT 09/07/2024. CLINICAL HISTORY: Shortness of breath. Patient with SOB. Hx of pneumonia and former smoker. FINDINGS: LINES, TUBES AND DEVICES: Enteric tube tip is in the mid stomach. LUNGS AND PLEURA: No focal pulmonary opacity. No pulmonary edema. No pleural effusion. No pneumothorax. HEART AND MEDIASTINUM: There is prominence of the upper right paratracheal region corresponding to ectatic vasculature and recent CT. The heart is mildly enlarged. BONES AND SOFT TISSUES: No acute osseous abnormality. IMPRESSION: 1. No acute findings. 2. Mildly enlarged heart. Electronically signed by: Greig Pique MD 09/21/2024 12:43 AM EDT RP Workstation: [id number]   DG Abd 2 Views Result Date: 09/20/2024 CLINICAL DATA:  Follow-up postoperative small bowel ileus or partial obstruction. Status post partial colectomy on 09/08/2024. EXAM: ABDOMEN - 2 VIEW COMPARISON:  09/17/2024 and abdomen and pelvis CT dated  09/16/2024 FINDINGS: Nasogastric tube tip in the mid to distal stomach and side hole in the proximal stomach. Mildly dilated loops of small bowel in the left mid abdomen with improvement. Skin clips. The included lungs are clear. Normal-sized heart. Thoracic spine degenerative changes. IMPRESSION: Improving small bowel ileus or partial obstruction. Electronically Signed   By: Elspeth Bathe M.D.   On: 09/20/2024 14:01   DG ABD ACUTE 2+V W 1V CHEST Result Date: 09/18/2024 EXAM: UPRIGHT AND SUPINE XRAY VIEWS OF THE ABDOMEN AND 4 VIEW(S) OF THE CHEST 09/18/2024 04:51:00 AM COMPARISON: 09/17/2024 CLINICAL HISTORY: Ileus FINDINGS: LUNGS AND PLEURA: No consolidation or pulmonary edema. Trace bilateral pleural effusions. No pneumothorax. HEART AND MEDIASTINUM: No acute abnormality of the cardiac and mediastinal silhouettes. BOWEL: Multiple gas-filled dilated loops of small bowel. No bowel obstruction. PERITONEUM AND SOFT TISSUES: Nasogastric tube tip and side port overlie the stomach. Midline surgical staples. Aortic atherosclerosis. No abnormal calcifications. No free air. BONES: No acute osseous abnormality. IMPRESSION: 1. Multiple gas-filled dilated loops of small bowel consistent with ileus. Electronically signed by: Waddell Calk MD 09/18/2024 05:31 AM EDT RP Workstation: [id number]   DG Abd Portable 1V Result Date: 09/17/2024 CLINICAL DATA:  Nasogastric tube placement. EXAM: PORTABLE ABDOMEN - 1 VIEW COMPARISON:  CT yesterday FINDINGS: Tip and side port of the enteric tube below the diaphragm in the stomach. Mild gaseous gastric distension. Mild gaseous small bowel distention in the upper abdomen. Midline skin staples in place. IMPRESSION: Tip and side port of the enteric tube below the diaphragm in the stomach. Electronically Signed   By: Andrea Gasman M.D.   On: 09/17/2024 11:43   CT ABDOMEN PELVIS W CONTRAST Result Date: 09/16/2024 EXAM: CT ABDOMEN AND PELVIS WITH CONTRAST 09/16/2024 06:07:12 PM  TECHNIQUE: CT of the abdomen and pelvis was performed with the administration of 100 mL of iohexol  (OMNIPAQUE ) 300 MG/ML solution. Multiplanar reformatted images are provided for review. Automated exposure control, iterative reconstruction, and/or weight-based adjustment of the mA/kV was utilized to reduce the radiation dose to as low as reasonably achievable. COMPARISON: CT 09/03/2024 CLINICAL HISTORY: Nausea, vomiting, and diarrhea. History of partial colectomy in October with complaints of pain at the surgical site. FINDINGS: LOWER CHEST: Small right and trace left pleural effusions with associated atelectasis. LIVER: The liver is unremarkable. GALLBLADDER AND BILE DUCTS: Gallbladder is unremarkable. No biliary ductal dilatation. SPLEEN: No acute abnormality. PANCREAS: No acute abnormality. ADRENAL GLANDS: No acute abnormality. KIDNEYS, URETERS AND BLADDER: No stones in the  kidneys or ureters. No hydronephrosis. No perinephric or periureteral stranding. Urinary bladder is unremarkable. GI AND BOWEL: Interval postoperative change of partial colectomy with ileocolonic anastomosis in the right hemiabdomen. Distended stomach. Diffuse dilation of the small bowel with relatively smooth tapering to the decompressed ileum. No abrupt transition point. Findings favor postoperative ileus. However the extent of dilation and the time past since the operation on 10/15 are somewhat unexpected. PERITONEUM AND RETROPERITONEUM: Loculated gas in the anterior abdomen near the umbilicus is favored to represent intramuscular air. Alternatively this may represent a locule of postoperative intraperitoneal air ( series 2 image 46). Free fluid and stranding in the right hemiabdomen is favored postoperative. Small 1.8x1.0 cm fluid collection in the right hemiabdomen on series 2 image 40 is favored a postoperative hematoma or seroma. Infection is not excluded by imaging. VASCULATURE: Aorta is normal in caliber. LYMPH NODES: No  lymphadenopathy. REPRODUCTIVE ORGANS: No acute abnormality. BONES AND SOFT TISSUES: No acute osseous abnormality. No focal soft tissue abnormality. IMPRESSION: 1. Interval postoperative change of partial colectomy with ileocolonic anastomosis in the right hemiabdomen. 2. Diffuse dilation of the small bowel with relatively smooth tapering to the decompressed ileum, without abrupt transition point, favoring postoperative ileus. The degree of dilation and timing is somewhat unexpected 8 days postoperatively. Consider gastrografin challenge for further evaluation. 3. Small amount of Free fluid and stranding in the right hemiabdomen is favored postoperative. Small 1.8 cm fluid collection in the right hemiabdomen is favored a postoperative hematoma or seroma. Infection is not excluded by imaging. 4. Small right and trace left pleural effusions with associated atelectasis. Electronically signed by: Norman Gatlin MD 09/16/2024 06:27 PM EDT RP Workstation: [id number]    ASSESSMENT: Stage IIIb adenocarcinoma of the colon.  PLAN:    Stage IIIb adenocarcinoma of the colon: Patient underwent surgical resection on September 08, 2024.  Although all her lymph nodes were negative, she did have a tumor deposit noted on pathology which increased her stage.  Despite recommendations of adjuvant FOLFOX, patient declined any further treatment.  She expressed understanding that this increases her risk of recurrence.  Also recommended lab work today with a postoperative CEA which patient refused as well.  No further intervention is needed.  Return to clinic in 3 months with repeat laboratory work and further evaluation. Anemia: Patient's hemoglobin has improved to 11.7, monitor. 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  It appears this was subsequently switched to Pradaxa  in approximately October 2025.  Appeared unprovoked, possibly related to her underlying colon  cancer which was not evident at that time.  Continue anticoagulation as prescribed.  Patient likely will only require approximately 6 months of treatment.  Patient expressed understanding and was in agreement with this plan. She also understands that She can call clinic at any time with any questions, concerns, or complaints.    Cancer Staging  Adenocarcinoma of colon [facility]) Staging form: Colon and Rectum, AJCC 8th Edition - Pathologic stage from 10/15/2024: Stage IIIB (pT3, pN1c, cM0) - Signed by Jacobo Evalene PARAS, MD on 10/15/2024 Stage prefix: Initial diagnosis Total positive nodes: 0   Evalene PARAS Jacobo, MD   10/15/2024 2:55 PM

## 2024-10-19 ENCOUNTER — Telehealth: Payer: Self-pay | Admitting: General Surgery

## 2024-10-20 ENCOUNTER — Inpatient Hospital Stay: Payer: Self-pay

## 2024-10-20 ENCOUNTER — Telehealth: Payer: Self-pay

## 2024-10-20 DIAGNOSIS — Z86718 Personal history of other venous thrombosis and embolism: Secondary | ICD-10-CM

## 2024-10-20 DIAGNOSIS — C189 Malignant neoplasm of colon, unspecified: Secondary | ICD-10-CM

## 2024-10-20 LAB — CMP (CANCER CENTER ONLY)
ALT: 16 U/L (ref 0–44)
AST: 21 U/L (ref 15–41)
Albumin: 3.4 g/dL — ABNORMAL LOW (ref 3.5–5.0)
Alkaline Phosphatase: 73 U/L (ref 38–126)
Anion gap: 6 (ref 5–15)
BUN: 9 mg/dL (ref 8–23)
CO2: 24 mmol/L (ref 22–32)
Calcium: 8.7 mg/dL — ABNORMAL LOW (ref 8.9–10.3)
Chloride: 106 mmol/L (ref 98–111)
Creatinine: 0.64 mg/dL (ref 0.44–1.00)
GFR, Estimated: >60 mL/min (ref >60)
Glucose, Bld: 129 mg/dL — ABNORMAL HIGH (ref 70–99)
Potassium: 4.2 mmol/L (ref 3.5–5.1)
Sodium: 136 mmol/L (ref 135–145)
Total Bilirubin: 1 mg/dL (ref 0.0–1.2)
Total Protein: 6.2 g/dL — ABNORMAL LOW (ref 6.5–8.1)

## 2024-10-20 LAB — CBC WITH DIFFERENTIAL (CANCER CENTER ONLY)
Abs Immature Granulocytes: 0.01 K/uL (ref 0.00–0.07)
Basophils Absolute: 0 K/uL (ref 0.0–0.1)
Basophils Relative: 1 %
Eosinophils Absolute: 0.2 K/uL (ref 0.0–0.5)
Eosinophils Relative: 5 %
HCT: 34.7 % — ABNORMAL LOW (ref 36.0–46.0)
Hemoglobin: 11.2 g/dL — ABNORMAL LOW (ref 12.0–15.0)
Immature Granulocytes: 0 %
Lymphocytes Relative: 31 %
Lymphs Abs: 1.2 K/uL (ref 0.7–4.0)
MCH: 27.5 pg (ref 26.0–34.0)
MCHC: 32.3 g/dL (ref 30.0–36.0)
MCV: 85.3 fL (ref 80.0–100.0)
Monocytes Absolute: 0.4 K/uL (ref 0.1–1.0)
Monocytes Relative: 9 %
Neutro Abs: 2 K/uL (ref 1.7–7.7)
Neutrophils Relative %: 54 %
Platelet Count: 219 K/uL (ref 150–400)
RBC: 4.07 MIL/uL (ref 3.87–5.11)
RDW: 15.7 % — ABNORMAL HIGH (ref 11.5–15.5)
WBC Count: 3.8 K/uL — ABNORMAL LOW (ref 4.0–10.5)
nRBC: 0 % (ref 0.0–0.2)

## 2024-10-20 NOTE — Telephone Encounter (Signed)
 Routing to provider and [place]. Do you guys know what the patient is referring to. I reviewed the patient's chart and she just saw Dr. Jacobo on 10/15/24 for a follow up. Dr. Jacobo advised for patient to continue medication as prescribed which is BID. Is there anything we need to do on our end?

## 2024-10-20 NOTE — Telephone Encounter (Signed)
 Copied from CRM #[id number]. Topic: Clinical - Medical Advice >> Oct 20, 2024  8:51 AM Amy B wrote: Reason for CRM: Patient states her oncologist at the cancer center will need to know how to refill her blood thinner since it is through a program.  She states that Channing Mealing set it up.  Please call patient with any questions [phone]

## 2024-10-20 NOTE — Telephone Encounter (Signed)
 Medication recently sent in for patient.

## 2024-10-21 LAB — CEA: CEA: 4.2 ng/mL (ref 0.0–4.7)

## 2024-10-25 NOTE — Telephone Encounter (Signed)
 Error

## 2024-10-26 ENCOUNTER — Telehealth: Payer: Self-pay | Admitting: Oncology

## 2024-10-26 NOTE — Telephone Encounter (Signed)
 Patient left VM requesting a call back to discuss her labs results from 11/26.

## 2024-10-27 ENCOUNTER — Inpatient Hospital Stay: Payer: Self-pay | Attending: Oncology

## 2024-10-27 DIAGNOSIS — C189 Malignant neoplasm of colon, unspecified: Secondary | ICD-10-CM

## 2024-10-27 NOTE — Progress Notes (Signed)
 Multidisciplinary Oncology Council Documentation  Adalee Kathan was presented by our [facility] on 10/27/2024, which included representatives from:  Palliative Care Dietitian  Physical/Occupational Therapist Nurse Navigator Genetics Social work Survivorship RN Financial Navigator Research RN   Adreonna currently presents with history of colon cancer  We reviewed previous medical and familial history, history of present illness, and recent lab results along with all available histopathologic and imaging studies. The MOC considered available treatment options and made the following recommendations/referrals:  None currently, consider PC  The MOC is a meeting of clinicians from various specialty areas who evaluate and discuss patients for whom a multidisciplinary approach is being considered. Final determinations in the plan of care are those of the provider(s).   Today's extended care, comprehensive team conference, Jemima was not present for the discussion and was not examined.

## 2024-11-01 NOTE — Telephone Encounter (Signed)
 Returned call to patient. Discussed that she could discontinue at this point however she says she has read that stopping could also cause clots so she wonders about that. I assured her that both Dr. Herold and Dr. Jacobo have discussed and she can stop however she would like to also confirm that with Dr. Jacobo as well. She will be taking atleast this morning normal dose until she is able to speak with him/his team.

## 2024-11-02 ENCOUNTER — Encounter: Admitting: General Surgery

## 2024-11-24 ENCOUNTER — Ambulatory Visit: Payer: Self-pay

## 2024-11-24 ENCOUNTER — Other Ambulatory Visit: Payer: Self-pay

## 2024-11-24 NOTE — Telephone Encounter (Signed)
 FYI Only or Action Required?: FYI only for provider: declines appt at alternate office, instructed to call surgeon.  Patient was last seen in primary care on 10/01/2024 by Monique Hadassah SQUIBB, MD.  Called Nurse Triage reporting Post-op Problem.  Symptoms began several weeks ago.  Interventions attempted: Nothing.  Symptoms are: gradually worsening.  Triage Disposition: See Physician Within 24 Hours  Patient/caregiver understands and will follow disposition?: No, refuses disposition  Declines appt, is social research officer, government.   Copied from CRM #[id number]. Topic: Clinical - Red Word Triage >> Nov 24, 2024  8:14 AM Tiffini S wrote: [facility] that prompted transfer to Nurse Triage: Cancer patient- bleeding Navel/ belly button area- painful/ tender in and around bleeding area- damp/ wet inside- patient had surgery where staples was removed Reason for Disposition  [1] INCREASING pain in incision AND [2] > 2 days (48 hours) since surgery  Answer Assessment - Initial Assessment Questions 1. SYMPTOM: What's the main symptom you're concerned about? (e.g., drainage, incision opened up, pain, redness)     pain 2. ONSET: When did pain  start?     Over a week ago 3. SURGERY: What surgery did you have?     COLECTOMY, PARTIAL 4. DATE of SURGERY: When was the surgery?      09/08/24 5. INCISION SITE: Where is the incision located?      Umbilical area 6. REDNESS: Is there any redness at the incision site? If Yes, ask: How wide across is the redness? (Inches, centimeters)      Mild irritation at umbilicus 7. PAIN: Is there any pain? If Yes, ask: How bad is it?  (Scale 0-10; or none, mild, moderate, severe)     Moderate pain 8. BLEEDING: Is there any bleeding? If Yes, ask: How much? and Where?      Was trying with q-tip last night and it had some blood on it 9. DRAINAGE: Is there any drainage from the incision site? If Yes, ask: What color and how much? (e.g., red, cloudy, pus; drops,  teaspoon)    denies 10. FEVER: Do you have a fever? If Yes, ask: What is your temperature, how was it measured, and when did it start?       denies 11. OTHER SYMPTOMS: Do you have any other symptoms? (e.g., dizziness, rash elsewhere on body, shaking chills, weakness)       No spreading redness  Protocols used: Post-Op Incision Symptoms and Questions-A-AH

## 2024-11-30 ENCOUNTER — Ambulatory Visit: Payer: Self-pay | Admitting: General Surgery

## 2024-11-30 ENCOUNTER — Encounter: Payer: Self-pay | Admitting: General Surgery

## 2024-11-30 VITALS — BP 160/84 | HR 76 | Temp 98.1°F | Ht 65.0 in | Wt 103.0 lb

## 2024-11-30 DIAGNOSIS — Z08 Encounter for follow-up examination after completed treatment for malignant neoplasm: Secondary | ICD-10-CM

## 2024-11-30 DIAGNOSIS — D490 Neoplasm of unspecified behavior of digestive system: Secondary | ICD-10-CM

## 2024-11-30 DIAGNOSIS — C180 Malignant neoplasm of cecum: Secondary | ICD-10-CM

## 2024-11-30 NOTE — Patient Instructions (Signed)
Minimally Invasive Partial Colectomy, Adult, Care After The following information offers guidance on how to care for yourself after your procedure. Your health care provider may also give you more specific instructions. If you have problems or questions, contact your health care provider. What can I expect after the surgery? After the procedure, it is common to have: Pain, bruising, and swelling. Bloating. Weakness and tiredness (fatigue). Changes to your bowel movements, especially having bowel movements more often. Follow these instructions at home: Medicines Take over-the-counter and prescription medicines only as told by your health care provider. If you were prescribed an antibiotic medicine, take it as told by your health care provider. Do not stop using the antibiotic even if you start to feel better. Ask your health care provider if the medicine prescribed to you: Requires you to avoid driving or using machinery. Can cause constipation. You may need to take these actions to prevent or treat constipation: Drink enough fluids to keep your urine pale yellow. Take over-the-counter or prescription medicines. Limit foods that are high in fat and processed sugars, such as fried or sweet foods. Eating and drinking Follow instructions from your health care provider about what you may eat and drink. Do not drink alcohol if your health care provider tells you not to drink. Eat a low-fiber diet for the first 4 weeks after surgery or as told by your health care provider.. Most people on a low-fiber eating plan should eat less than 10 grams (g) of fiber a day. Follow recommendations from your health care provider or dietitian about how much fiber you should have each day. Always check food labels to know the fiber content of packaged foods. In general, a low-fiber food will have fewer than 2 g of fiber per serving. In general, try to avoid whole grains, raw fruits and vegetables, dried fruit, tough  cuts of meat, nuts, and seeds. Incision care  Follow instructions from your health care provider about how to take care of your incisions. Make sure you: Wash your hands with soap and water for at least 20 seconds before and after you change your bandage (dressing). If soap and water are not available, use hand sanitizer. Change your dressing as told by your health care provider. Leave stitches (sutures), skin glue, or adhesive strips in place. These skin closures may need to stay in place for 2 weeks or longer. If adhesive strip edges start to loosen and curl up, you may trim the loose edges. Do not remove adhesive strips completely unless your health care provider tells you to do that. Check your incision area every day for signs of infection. Check for: More redness, swelling, or pain. Fluid or blood. Warmth. Pus or a bad smell. Activity Rest as told by your health care provider. Avoid sitting for a long time without moving. Get up to take short walks every 1-2 hours. This is important to improve blood flow and breathing. Ask for help if you feel weak or unsteady. You may have to avoid lifting. Ask your health care provider how much you can safely lift. Return to your normal activities as told by your health care provider. Ask your health care provider what activities are safe for you. General instructions Do not use any products that contain nicotine or tobacco. These products include cigarettes, chewing tobacco, and vaping devices, such as e-cigarettes. If you need help quitting, ask your health care provider. Do not take baths, swim, or use a hot tub until your health care provider  approves. Ask your health care provider if you may take showers. You may only be allowed to take sponge baths. Wear compression stockings as told by your health care provider. These stockings help to prevent blood clots and reduce swelling in your legs. Keep all follow-up visits. This is important to monitor  healing and check for any complications. Contact a health care provider if: Medicine is not controlling your pain. You have chills or fever. You have any signs of infection in your incision areas. You have a persistent cough. You have nausea or vomiting. You develop a rash. You have not had a bowel movement in 3 days. Get help right away if: You have severe pain. Your incisions break open after sutures or staples have been removed. You are bleeding from your rectum or have blood in your stool. You have a warm, tender swelling in your leg. You have chest pain or trouble breathing. You have increased swelling in the abdomen. You feel light-headed or you faint. These symptoms may be an emergency. Get help right away. Call 911. Do not wait to see if the symptoms will go away. Do not drive yourself to the hospital. Summary After surgery, it is common to have some pain, bruising, swelling, bloating, tiredness, weakness, or changes to your bowel movements. Follow instructions from your health care provider about what to eat and drink. Return to your normal activities as told by your health care provider. Check your incision area every day for signs of infection. Get help right away if you have chest pain or trouble breathing. This information is not intended to replace advice given to you by your health care provider. Make sure you discuss any questions you have with your health care provider. Document Revised: 02/27/2022 Document Reviewed: 02/27/2022 Elsevier Patient Education  2024 [employer].

## 2024-12-09 NOTE — Progress Notes (Signed)
 Outpatient Surgical Follow Up    Monique [name] is an 74 y.o. female.   Chief Complaint  Patient presents with   Routine Post Op    Colectomy 10/25    HPI: Patient returns today status post right colectomy for colon cancer.  Several days prior to her appointment she reported some irritation around her navel.  She says that she scratched at it and started have a little bit of bleeding.  She is tolerating a diet and having normal bowel function.  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    Past Surgical History:  Procedure Laterality Date   BREAST LUMPECTOMY     Rt breast   COLONOSCOPY N/A 09/06/2024   Procedure: COLONOSCOPY;  Surgeon: Jinny Carmine, MD;  Location: [facility] ENDOSCOPY;  Service: Endoscopy;  Laterality: N/A;   ESOPHAGOGASTRODUODENOSCOPY N/A 09/06/2024   Procedure: EGD (ESOPHAGOGASTRODUODENOSCOPY);  Surgeon: Jinny Carmine, MD;  Location: [facility] ENDOSCOPY;  Service: Endoscopy;  Laterality: N/A;   PARTIAL COLECTOMY N/A 09/08/2024   Procedure: COLECTOMY, PARTIAL;  Surgeon: Marinda Jayson KIDD, MD;  Location: [facility] ORS;  Service: General;  Laterality: N/A;   TUBAL LIGATION  1980s   Didn't want any more kids    Family History  Problem Relation Age of Onset   Emphysema Maternal Grandfather        smoked a pipe   COPD Maternal Grandfather    Depression Maternal Grandfather    Stomach cancer Maternal Aunt        Caused by exposure to xray as a technician before it was known of the harm that could be done.   Lung cancer Maternal Uncle        was a smoker   Miscarriages / Stillbirths Mother    Miscarriages / Stillbirths Father     (Hardening  of the arteries) Maternal  Grandmother    Alcohol abuse Brother     Social History:  reports that she quit smoking about 13 years ago. Her smoking use included cigarettes. She started smoking about 55 years ago. She has a 33.6 pack-year smoking history. She has never used smokeless tobacco. She reports that she does not drink alcohol and does not use drugs.  Allergies: Allergies[1]  Medications reviewed.    ROS Full ROS performed and is otherwise negative other than what is stated in HPI   BP (!) 160/84   Pulse 76   Temp 98.1 F (36.7 C) (Oral)   Ht 5' 5 (1.651 m)   Wt 103 lb (46.7 kg)   LMP  (LMP Unknown)   SpO2 100%   BMI 17.14 kg/m   Physical Exam Irritation to her navel.  There is a little bit of erythema secondary to her scratching most likely.  There is no spreading erythema to suggest infection.  There is a little bit of bloody drainage from the area.  No signs of abscess or infection.    No results found for this or any previous visit (from the past 48 hours). No results found.  Assessment/Plan:  Patient status post colectomy with some irritation around her navel.  No evidence of cellulitis.  Draining a little bit of bloody fluid.  I instructed her to place gauze over the area and to monitor.  She is tolerating a diet and having normal bowel function.  If she starts to have purulent drainage or worsening drainage she will call us  back.  Otherwise we will see her in several weeks to see how the wound is doing.   Jayson Endow, M.D. [facility]     [1]  Allergies Allergen Reactions   Penicillins     bleeding

## 2024-12-14 ENCOUNTER — Ambulatory Visit: Payer: Self-pay | Admitting: General Surgery

## 2024-12-14 ENCOUNTER — Encounter: Payer: Self-pay | Admitting: General Surgery

## 2024-12-14 ENCOUNTER — Other Ambulatory Visit: Payer: Self-pay

## 2024-12-14 VITALS — BP 169/78 | HR 71 | Temp 97.8°F | Ht 65.0 in | Wt 105.4 lb

## 2024-12-14 DIAGNOSIS — C189 Malignant neoplasm of colon, unspecified: Secondary | ICD-10-CM

## 2024-12-14 DIAGNOSIS — C180 Malignant neoplasm of cecum: Secondary | ICD-10-CM

## 2024-12-14 DIAGNOSIS — Z08 Encounter for follow-up examination after completed treatment for malignant neoplasm: Secondary | ICD-10-CM

## 2024-12-14 MED ORDER — LIDOCAINE 5 % EX PTCH: 1.0000 | MEDICATED_PATCH | Freq: Every day | CUTANEOUS | 0 refills | Status: AC | PRN

## 2024-12-14 MED ORDER — COMIRNATY 30 MCG/0.3ML IM SUSY
0.3000 mL | PREFILLED_SYRINGE | Freq: Once | INTRAMUSCULAR | 0 refills | Status: AC
  Filled 2024-12-14: qty 0.3, 1d supply, fill #0

## 2024-12-14 NOTE — Patient Instructions (Signed)
Minimally Invasive Partial Colectomy, Adult, Care After The following information offers guidance on how to care for yourself after your procedure. Your health care provider may also give you more specific instructions. If you have problems or questions, contact your health care provider. What can I expect after the surgery? After the procedure, it is common to have: Pain, bruising, and swelling. Bloating. Weakness and tiredness (fatigue). Changes to your bowel movements, especially having bowel movements more often. Follow these instructions at home: Medicines Take over-the-counter and prescription medicines only as told by your health care provider. If you were prescribed an antibiotic medicine, take it as told by your health care provider. Do not stop using the antibiotic even if you start to feel better. Ask your health care provider if the medicine prescribed to you: Requires you to avoid driving or using machinery. Can cause constipation. You may need to take these actions to prevent or treat constipation: Drink enough fluids to keep your urine pale yellow. Take over-the-counter or prescription medicines. Limit foods that are high in fat and processed sugars, such as fried or sweet foods. Eating and drinking Follow instructions from your health care provider about what you may eat and drink. Do not drink alcohol if your health care provider tells you not to drink. Eat a low-fiber diet for the first 4 weeks after surgery or as told by your health care provider.. Most people on a low-fiber eating plan should eat less than 10 grams (g) of fiber a day. Follow recommendations from your health care provider or dietitian about how much fiber you should have each day. Always check food labels to know the fiber content of packaged foods. In general, a low-fiber food will have fewer than 2 g of fiber per serving. In general, try to avoid whole grains, raw fruits and vegetables, dried fruit, tough  cuts of meat, nuts, and seeds. Incision care  Follow instructions from your health care provider about how to take care of your incisions. Make sure you: Wash your hands with soap and water for at least 20 seconds before and after you change your bandage (dressing). If soap and water are not available, use hand sanitizer. Change your dressing as told by your health care provider. Leave stitches (sutures), skin glue, or adhesive strips in place. These skin closures may need to stay in place for 2 weeks or longer. If adhesive strip edges start to loosen and curl up, you may trim the loose edges. Do not remove adhesive strips completely unless your health care provider tells you to do that. Check your incision area every day for signs of infection. Check for: More redness, swelling, or pain. Fluid or blood. Warmth. Pus or a bad smell. Activity Rest as told by your health care provider. Avoid sitting for a long time without moving. Get up to take short walks every 1-2 hours. This is important to improve blood flow and breathing. Ask for help if you feel weak or unsteady. You may have to avoid lifting. Ask your health care provider how much you can safely lift. Return to your normal activities as told by your health care provider. Ask your health care provider what activities are safe for you. General instructions Do not use any products that contain nicotine or tobacco. These products include cigarettes, chewing tobacco, and vaping devices, such as e-cigarettes. If you need help quitting, ask your health care provider. Do not take baths, swim, or use a hot tub until your health care provider  approves. Ask your health care provider if you may take showers. You may only be allowed to take sponge baths. Wear compression stockings as told by your health care provider. These stockings help to prevent blood clots and reduce swelling in your legs. Keep all follow-up visits. This is important to monitor  healing and check for any complications. Contact a health care provider if: Medicine is not controlling your pain. You have chills or fever. You have any signs of infection in your incision areas. You have a persistent cough. You have nausea or vomiting. You develop a rash. You have not had a bowel movement in 3 days. Get help right away if: You have severe pain. Your incisions break open after sutures or staples have been removed. You are bleeding from your rectum or have blood in your stool. You have a warm, tender swelling in your leg. You have chest pain or trouble breathing. You have increased swelling in the abdomen. You feel light-headed or you faint. These symptoms may be an emergency. Get help right away. Call 911. Do not wait to see if the symptoms will go away. Do not drive yourself to the hospital. Summary After surgery, it is common to have some pain, bruising, swelling, bloating, tiredness, weakness, or changes to your bowel movements. Follow instructions from your health care provider about what to eat and drink. Return to your normal activities as told by your health care provider. Check your incision area every day for signs of infection. Get help right away if you have chest pain or trouble breathing. This information is not intended to replace advice given to you by your health care provider. Make sure you discuss any questions you have with your health care provider. Document Revised: 02/27/2022 Document Reviewed: 02/27/2022 Elsevier Patient Education  2024 [employer].

## 2024-12-20 NOTE — Progress Notes (Signed)
 Outpatient Surgical Follow Up Monique [name] is an 74 y.o. female.   Chief Complaint  Patient presents with   Follow-up    Colectomy 09/08/2024    HPI: Patient returns status post open right hemicolectomy with ileocolonic anastomosis.  She saw me several weeks ago because there was a little bit of granulation tissue at her umbilicus with some bleeding.  She reports that she was doing well but then yesterday she noticed a string coming out of her umbilicus.  She pulled this out and was very concerned about it.  She is tolerating a diet and having normal bowel function.  She says that she is actually starting to gain some weight.  Denies any blood in her stool.  She brought with her this string and it is just a suture that has spit out. She reports she continues to have pain over her incision.  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    Past Surgical History:  Procedure Laterality Date   BREAST LUMPECTOMY     Rt breast   COLONOSCOPY N/A 09/06/2024   Procedure: COLONOSCOPY;  Surgeon: Jinny Carmine, MD;  Location: [facility] ENDOSCOPY;  Service: Endoscopy;  Laterality: N/A;   ESOPHAGOGASTRODUODENOSCOPY N/A 09/06/2024   Procedure: EGD (ESOPHAGOGASTRODUODENOSCOPY);  Surgeon: Jinny Carmine, MD;  Location: [facility] ENDOSCOPY;  Service: Endoscopy;  Laterality: N/A;   PARTIAL COLECTOMY N/A 09/08/2024   Procedure: COLECTOMY, PARTIAL;  Surgeon: Marinda Jayson KIDD, MD;  Location: [facility] ORS;  Service: General;  Laterality: N/A;   TUBAL LIGATION  1980s   Didn't want any more kids    Family History  Problem Relation Age of Onset   Emphysema Maternal Grandfather        smoked a pipe   COPD Maternal  Grandfather    Depression Maternal Grandfather    Stomach cancer Maternal Aunt        Caused by exposure to xray as a technician before it was known of the harm that could be done.   Lung cancer Maternal Uncle        was a smoker   Miscarriages / Stillbirths Mother    Miscarriages / Stillbirths Father     (Hardening  of the arteries) Maternal Grandmother    Alcohol abuse Brother     Social History:  reports that she quit smoking about 13 years ago. Her smoking use included cigarettes. She started smoking about 55 years ago. She has a 33.6 pack-year smoking history. She has never used smokeless tobacco. She reports that she does not drink alcohol and does not use drugs.  Allergies: Allergies[1]  Medications reviewed.    ROS Full ROS performed and is otherwise negative other than what is stated in HPI   BP (!) 169/78   Pulse 71   Temp 97.8 F (36.6 C) (Oral)   Ht 5' 5 (1.651 m)   Wt 105 lb 6.4 oz (47.8 kg)   LMP  (LMP Unknown)   SpO2 96%   BMI 17.54 kg/m   Physical Exam  Abdomen is soft, nontender nondistended, midline incision has healed well.  At her umbilicus there is no opening  of the skin.  I do not appreciate any hernias.  There is no more granulation tissue at her umbilicus.   No results found for this or any previous visit (from the past 48 hours). No results found.  Assessment/Plan:  Patient status post open right hemicolectomy for colon cancer.  She had a stitch that spit out.  She was very concerned about this and I discussed with her that the fascia has seemed to heal and there is no evidence of hernia formation.  Her wound looks good.  I will see her again in 6 months after surgery.  We will prescribe lidocaine  patches for her abdominal pain.  A total of 22 minutes was spent reviewing the patient's chart, performing interval history and physical and discussing treatment options with the patient   Jayson Endow, M.D. [facility]      [1]  Allergies Allergen Reactions   Penicillins     bleeding

## 2025-01-06 ENCOUNTER — Ambulatory Visit: Admitting: General Surgery

## 2025-01-17 ENCOUNTER — Encounter: Payer: Self-pay | Admitting: Nurse Practitioner

## 2025-01-19 ENCOUNTER — Inpatient Hospital Stay: Payer: Self-pay

## 2025-01-20 ENCOUNTER — Inpatient Hospital Stay: Payer: Self-pay | Admitting: Oncology

## 2025-05-03 ENCOUNTER — Encounter: Payer: Self-pay | Admitting: Pediatrics
